# Patient Record
Sex: Female | Born: 1935
Health system: Southern US, Community
[De-identification: ages and names within clinical notes are randomized; demographics above are authoritative.]

## PROBLEM LIST (undated history)

## (undated) DIAGNOSIS — I712 Thoracic aortic aneurysm, without rupture, unspecified: Secondary | ICD-10-CM

## (undated) DIAGNOSIS — G4700 Insomnia, unspecified: Secondary | ICD-10-CM

## (undated) DIAGNOSIS — J302 Other seasonal allergic rhinitis: Secondary | ICD-10-CM

## (undated) DIAGNOSIS — I639 Cerebral infarction, unspecified: Secondary | ICD-10-CM

## (undated) DIAGNOSIS — R269 Unspecified abnormalities of gait and mobility: Secondary | ICD-10-CM

## (undated) DIAGNOSIS — R413 Other amnesia: Secondary | ICD-10-CM

## (undated) DIAGNOSIS — I341 Nonrheumatic mitral (valve) prolapse: Secondary | ICD-10-CM

## (undated) DIAGNOSIS — F32A Depression, unspecified: Secondary | ICD-10-CM

## (undated) DIAGNOSIS — M810 Age-related osteoporosis without current pathological fracture: Secondary | ICD-10-CM

## (undated) DIAGNOSIS — F329 Major depressive disorder, single episode, unspecified: Secondary | ICD-10-CM

## (undated) DIAGNOSIS — I4891 Unspecified atrial fibrillation: Secondary | ICD-10-CM

## (undated) DIAGNOSIS — M797 Fibromyalgia: Secondary | ICD-10-CM

## (undated) DIAGNOSIS — N3020 Other chronic cystitis without hematuria: Secondary | ICD-10-CM

## (undated) DIAGNOSIS — I350 Nonrheumatic aortic (valve) stenosis: Secondary | ICD-10-CM

## (undated) DIAGNOSIS — F419 Anxiety disorder, unspecified: Secondary | ICD-10-CM

## (undated) DIAGNOSIS — K219 Gastro-esophageal reflux disease without esophagitis: Secondary | ICD-10-CM

## (undated) DIAGNOSIS — E119 Type 2 diabetes mellitus without complications: Secondary | ICD-10-CM

## (undated) DIAGNOSIS — Z8673 Personal history of transient ischemic attack (TIA), and cerebral infarction without residual deficits: Secondary | ICD-10-CM

## (undated) DIAGNOSIS — N814 Uterovaginal prolapse, unspecified: Secondary | ICD-10-CM

## (undated) DIAGNOSIS — M1990 Unspecified osteoarthritis, unspecified site: Secondary | ICD-10-CM

## (undated) DIAGNOSIS — J449 Chronic obstructive pulmonary disease, unspecified: Secondary | ICD-10-CM

## (undated) HISTORY — PX: APPENDECTOMY: SHX54

## (undated) HISTORY — DX: Gastro-esophageal reflux disease without esophagitis: K21.9

## (undated) HISTORY — DX: Unspecified atrial fibrillation: I48.91

## (undated) HISTORY — DX: Cerebral infarction, unspecified: I63.9

## (undated) HISTORY — DX: Anxiety disorder, unspecified: F41.9

## (undated) HISTORY — DX: Major depressive disorder, single episode, unspecified: F32.9

## (undated) HISTORY — DX: Other amnesia: R41.3

## (undated) HISTORY — PX: TUBAL LIGATION: SHX77

## (undated) HISTORY — DX: Other seasonal allergic rhinitis: J30.2

## (undated) HISTORY — DX: Chronic obstructive pulmonary disease, unspecified: J44.9

## (undated) HISTORY — PX: OTHER SURGICAL HISTORY: SHX169

## (undated) HISTORY — PX: HERNIA REPAIR: SHX51

## (undated) HISTORY — DX: Nonrheumatic mitral (valve) prolapse: I34.1

## (undated) HISTORY — DX: Age-related osteoporosis without current pathological fracture: M81.0

## (undated) HISTORY — DX: Uterovaginal prolapse, unspecified: N81.4

## (undated) HISTORY — DX: Nonrheumatic aortic (valve) stenosis: I35.0

## (undated) HISTORY — DX: Thoracic aortic aneurysm, without rupture, unspecified: I71.20

## (undated) HISTORY — DX: Thoracic aortic aneurysm, without rupture: I71.2

## (undated) HISTORY — DX: Type 2 diabetes mellitus without complications: E11.9

## (undated) HISTORY — DX: Insomnia, unspecified: G47.00

## (undated) HISTORY — DX: Personal history of transient ischemic attack (TIA), and cerebral infarction without residual deficits: Z86.73

## (undated) HISTORY — DX: Fibromyalgia: M79.7

## (undated) HISTORY — DX: Unspecified abnormalities of gait and mobility: R26.9

## (undated) HISTORY — DX: Other chronic cystitis without hematuria: N30.20

## (undated) HISTORY — DX: Depression, unspecified: F32.A

## (undated) HISTORY — PX: EYE SURGERY: SHX253

## (undated) HISTORY — DX: Unspecified osteoarthritis, unspecified site: M19.90

---
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
## 1997-11-28 ENCOUNTER — Other Ambulatory Visit: Admission: RE | Admit: 1997-11-28 | Discharge: 1997-11-28 | Payer: Self-pay | Admitting: Obstetrics and Gynecology

## 1998-12-27 ENCOUNTER — Other Ambulatory Visit: Admission: RE | Admit: 1998-12-27 | Discharge: 1998-12-27 | Payer: Self-pay | Admitting: Obstetrics and Gynecology

## 1999-12-31 ENCOUNTER — Other Ambulatory Visit: Admission: RE | Admit: 1999-12-31 | Discharge: 1999-12-31 | Payer: Self-pay | Admitting: Obstetrics and Gynecology

## 2001-01-07 ENCOUNTER — Other Ambulatory Visit: Admission: RE | Admit: 2001-01-07 | Discharge: 2001-01-07 | Payer: Self-pay | Admitting: Obstetrics and Gynecology

## 2003-10-07 ENCOUNTER — Emergency Department (HOSPITAL_COMMUNITY): Admission: EM | Admit: 2003-10-07 | Discharge: 2003-10-07 | Payer: Self-pay | Admitting: *Deleted

## 2003-10-18 ENCOUNTER — Ambulatory Visit (HOSPITAL_COMMUNITY): Admission: RE | Admit: 2003-10-18 | Discharge: 2003-10-18 | Payer: Self-pay | Admitting: Pulmonary Disease

## 2003-12-05 ENCOUNTER — Encounter: Payer: Self-pay | Admitting: Internal Medicine

## 2004-01-04 ENCOUNTER — Ambulatory Visit: Payer: Self-pay | Admitting: Pulmonary Disease

## 2004-02-20 ENCOUNTER — Other Ambulatory Visit: Admission: RE | Admit: 2004-02-20 | Discharge: 2004-02-20 | Payer: Self-pay | Admitting: Obstetrics and Gynecology

## 2004-04-03 ENCOUNTER — Ambulatory Visit: Payer: Self-pay | Admitting: Pulmonary Disease

## 2004-08-01 ENCOUNTER — Ambulatory Visit: Payer: Self-pay | Admitting: Pulmonary Disease

## 2004-08-19 ENCOUNTER — Ambulatory Visit: Payer: Self-pay | Admitting: Gastroenterology

## 2004-09-06 ENCOUNTER — Ambulatory Visit: Payer: Self-pay | Admitting: Gastroenterology

## 2004-10-01 ENCOUNTER — Ambulatory Visit: Payer: Self-pay | Admitting: Gastroenterology

## 2004-10-01 ENCOUNTER — Encounter: Payer: Self-pay | Admitting: Internal Medicine

## 2004-11-22 ENCOUNTER — Ambulatory Visit (HOSPITAL_COMMUNITY): Admission: RE | Admit: 2004-11-22 | Discharge: 2004-11-22 | Payer: Self-pay | Admitting: Pulmonary Disease

## 2004-11-22 ENCOUNTER — Ambulatory Visit: Payer: Self-pay | Admitting: Pulmonary Disease

## 2005-01-30 ENCOUNTER — Ambulatory Visit: Payer: Self-pay | Admitting: Pulmonary Disease

## 2005-06-18 ENCOUNTER — Ambulatory Visit: Payer: Self-pay | Admitting: Pulmonary Disease

## 2005-10-07 ENCOUNTER — Ambulatory Visit: Payer: Self-pay | Admitting: Pulmonary Disease

## 2005-10-22 ENCOUNTER — Ambulatory Visit: Payer: Self-pay | Admitting: Pulmonary Disease

## 2006-02-05 ENCOUNTER — Ambulatory Visit: Payer: Self-pay | Admitting: Pulmonary Disease

## 2006-06-16 ENCOUNTER — Ambulatory Visit: Payer: Self-pay | Admitting: Pulmonary Disease

## 2006-07-28 ENCOUNTER — Ambulatory Visit: Payer: Self-pay | Admitting: Pulmonary Disease

## 2006-07-28 LAB — CONVERTED CEMR LAB
Cholesterol: 217 mg/dL (ref 0–200)
Direct LDL: 137.6 mg/dL
HDL: 40.1 mg/dL (ref >39.0)
Total CHOL/HDL Ratio: 5.4
VLDL: 30 mg/dL (ref 0–40)

## 2006-12-22 ENCOUNTER — Ambulatory Visit: Payer: Self-pay | Admitting: Pulmonary Disease

## 2007-02-10 ENCOUNTER — Telehealth: Payer: Self-pay | Admitting: Pulmonary Disease

## 2007-02-10 ENCOUNTER — Encounter: Payer: Self-pay | Admitting: Pulmonary Disease

## 2007-02-10 DIAGNOSIS — IMO0001 Reserved for inherently not codable concepts without codable children: Secondary | ICD-10-CM | POA: Insufficient documentation

## 2007-02-10 DIAGNOSIS — E785 Hyperlipidemia, unspecified: Secondary | ICD-10-CM | POA: Insufficient documentation

## 2007-02-10 DIAGNOSIS — I059 Rheumatic mitral valve disease, unspecified: Secondary | ICD-10-CM | POA: Insufficient documentation

## 2007-02-10 DIAGNOSIS — M810 Age-related osteoporosis without current pathological fracture: Secondary | ICD-10-CM | POA: Insufficient documentation

## 2007-02-10 DIAGNOSIS — N6019 Diffuse cystic mastopathy of unspecified breast: Secondary | ICD-10-CM | POA: Insufficient documentation

## 2007-04-22 ENCOUNTER — Ambulatory Visit: Payer: Self-pay | Admitting: Pulmonary Disease

## 2007-04-22 DIAGNOSIS — I679 Cerebrovascular disease, unspecified: Secondary | ICD-10-CM | POA: Insufficient documentation

## 2007-04-22 DIAGNOSIS — I739 Peripheral vascular disease, unspecified: Secondary | ICD-10-CM | POA: Insufficient documentation

## 2007-04-22 DIAGNOSIS — K2990 Gastroduodenitis, unspecified, without bleeding: Secondary | ICD-10-CM

## 2007-04-22 DIAGNOSIS — K2970 Gastritis, unspecified, without bleeding: Secondary | ICD-10-CM | POA: Insufficient documentation

## 2007-04-22 DIAGNOSIS — K5730 Diverticulosis of large intestine without perforation or abscess without bleeding: Secondary | ICD-10-CM | POA: Insufficient documentation

## 2007-04-22 DIAGNOSIS — K589 Irritable bowel syndrome without diarrhea: Secondary | ICD-10-CM | POA: Insufficient documentation

## 2007-04-22 LAB — CONVERTED CEMR LAB
Alkaline Phosphatase: 58 units/L (ref 39–117)
Basophils Absolute: 0 10*3/uL (ref 0.0–0.1)
Bilirubin, Direct: 0.2 mg/dL (ref 0.0–0.3)
Eosinophils Absolute: 0.1 10*3/uL (ref 0.0–0.6)
Eosinophils Relative: 2.1 % (ref 0.0–5.0)
GFR calc Af Amer: 105 mL/min
GFR calc non Af Amer: 87 mL/min
Glucose, Bld: 92 mg/dL (ref 70–99)
HCT: 38.6 % (ref 36.0–46.0)
HDL: 47.2 mg/dL (ref >39.0)
MCHC: 33.1 g/dL (ref 30.0–36.0)
Monocytes Absolute: 0.6 10*3/uL (ref 0.2–0.7)
Monocytes Relative: 9.2 % (ref 3.0–11.0)
RBC: 4.26 M/uL (ref 3.87–5.11)
Sodium: 141 meq/L (ref 135–145)
TSH: 1.25 microintl units/mL (ref 0.35–5.50)
Total CHOL/HDL Ratio: 3.9
Total Protein: 6.8 g/dL (ref 6.0–8.3)
Triglycerides: 77 mg/dL (ref 0–149)
VLDL: 15 mg/dL (ref 0–40)
WBC: 6.3 10*3/uL (ref 4.5–10.5)

## 2007-08-10 ENCOUNTER — Ambulatory Visit: Payer: Self-pay | Admitting: Pulmonary Disease

## 2008-02-10 ENCOUNTER — Ambulatory Visit: Payer: Self-pay | Admitting: Pulmonary Disease

## 2008-02-10 DIAGNOSIS — N812 Incomplete uterovaginal prolapse: Secondary | ICD-10-CM | POA: Insufficient documentation

## 2008-05-08 ENCOUNTER — Encounter: Admission: RE | Admit: 2008-05-08 | Discharge: 2008-05-08 | Payer: Self-pay | Admitting: Obstetrics and Gynecology

## 2008-06-06 ENCOUNTER — Encounter: Payer: Self-pay | Admitting: Pulmonary Disease

## 2008-08-10 ENCOUNTER — Ambulatory Visit: Payer: Self-pay | Admitting: Pulmonary Disease

## 2008-08-10 DIAGNOSIS — R636 Underweight: Secondary | ICD-10-CM | POA: Insufficient documentation

## 2008-08-10 LAB — CONVERTED CEMR LAB: Vit D, 25-Hydroxy: 63 ng/mL (ref 30–89)

## 2008-08-25 LAB — CONVERTED CEMR LAB
ALT: 18 units/L (ref 0–35)
Basophils Relative: 0.2 % (ref 0.0–3.0)
Bilirubin, Direct: 0.2 mg/dL (ref 0.0–0.3)
Creatinine, Ser: 0.8 mg/dL (ref 0.4–1.2)
Direct LDL: 141.9 mg/dL
Eosinophils Absolute: 0.1 10*3/uL (ref 0.0–0.7)
HCT: 38.3 % (ref 36.0–46.0)
HDL: 57 mg/dL (ref >39.00)
Lymphocytes Relative: 29.3 % (ref 12.0–46.0)
Lymphs Abs: 1.9 10*3/uL (ref 0.7–4.0)
MCHC: 34 g/dL (ref 30.0–36.0)
MCV: 90.3 fL (ref 78.0–100.0)
Monocytes Absolute: 0.5 10*3/uL (ref 0.1–1.0)
Neutro Abs: 3.9 10*3/uL (ref 1.4–7.7)
Neutrophils Relative %: 61.9 % (ref 43.0–77.0)
Platelets: 214 10*3/uL (ref 150.0–400.0)
RBC: 4.24 M/uL (ref 3.87–5.11)
RDW: 12.1 % (ref 11.5–14.6)
TSH: 0.76 microintl units/mL (ref 0.35–5.50)
Total CHOL/HDL Ratio: 4
Triglycerides: 85 mg/dL (ref 0.0–149.0)
VLDL: 17 mg/dL (ref 0.0–40.0)
Vitamin B-12: >1500 pg/mL — ABNORMAL HIGH (ref 211–911)

## 2008-09-21 ENCOUNTER — Telehealth: Payer: Self-pay | Admitting: Pulmonary Disease

## 2008-11-06 ENCOUNTER — Telehealth (INDEPENDENT_AMBULATORY_CARE_PROVIDER_SITE_OTHER): Payer: Self-pay | Admitting: *Deleted

## 2009-01-01 ENCOUNTER — Telehealth: Payer: Self-pay | Admitting: Pulmonary Disease

## 2009-04-16 ENCOUNTER — Telehealth (INDEPENDENT_AMBULATORY_CARE_PROVIDER_SITE_OTHER): Payer: Self-pay | Admitting: *Deleted

## 2009-05-03 ENCOUNTER — Ambulatory Visit: Payer: Self-pay | Admitting: Pulmonary Disease

## 2009-05-03 ENCOUNTER — Encounter (INDEPENDENT_AMBULATORY_CARE_PROVIDER_SITE_OTHER): Payer: Self-pay | Admitting: *Deleted

## 2009-05-03 DIAGNOSIS — R059 Cough, unspecified: Secondary | ICD-10-CM | POA: Insufficient documentation

## 2009-05-03 DIAGNOSIS — R1319 Other dysphagia: Secondary | ICD-10-CM | POA: Insufficient documentation

## 2009-05-03 DIAGNOSIS — R05 Cough: Secondary | ICD-10-CM

## 2009-05-06 DIAGNOSIS — R259 Unspecified abnormal involuntary movements: Secondary | ICD-10-CM | POA: Insufficient documentation

## 2009-05-06 LAB — CONVERTED CEMR LAB
ALT: 27 units/L (ref 0–35)
AST: 25 units/L (ref 0–37)
Albumin: 4.5 g/dL (ref 3.5–5.2)
BUN: 11 mg/dL (ref 6–23)
Basophils Relative: 0.5 % (ref 0.0–3.0)
Bilirubin, Direct: 0.1 mg/dL (ref 0.0–0.3)
Eosinophils Absolute: 0 10*3/uL (ref 0.0–0.7)
GFR calc non Af Amer: 103.48 mL/min (ref >60)
Glucose, Bld: 100 mg/dL — ABNORMAL HIGH (ref 70–99)
HCT: 38 % (ref 36.0–46.0)
Hemoglobin: 12.6 g/dL (ref 12.0–15.0)
LDL Cholesterol: 106 mg/dL — ABNORMAL HIGH (ref 0–99)
MCHC: 33.1 g/dL (ref 30.0–36.0)
MCV: 93 fL (ref 78.0–100.0)
Monocytes Absolute: 0.4 10*3/uL (ref 0.1–1.0)
RBC: 4.08 M/uL (ref 3.87–5.11)
RDW: 11.9 % (ref 11.5–14.6)
Sed Rate: 11 mm/hr (ref 0–22)
TSH: 1.05 microintl units/mL (ref 0.35–5.50)
Total Bilirubin: 0.6 mg/dL (ref 0.3–1.2)
Total CHOL/HDL Ratio: 3
Total Protein: 7.3 g/dL (ref 6.0–8.3)
Triglycerides: 65 mg/dL (ref 0.0–149.0)
VLDL: 13 mg/dL (ref 0.0–40.0)
Vit D, 25-Hydroxy: 74 ng/mL (ref 30–89)
WBC: 6.5 10*3/uL (ref 4.5–10.5)

## 2009-05-31 ENCOUNTER — Telehealth (INDEPENDENT_AMBULATORY_CARE_PROVIDER_SITE_OTHER): Payer: Self-pay | Admitting: *Deleted

## 2009-05-31 ENCOUNTER — Encounter: Admission: RE | Admit: 2009-05-31 | Discharge: 2009-05-31 | Payer: Self-pay | Admitting: Pulmonary Disease

## 2009-06-07 ENCOUNTER — Ambulatory Visit: Payer: Self-pay | Admitting: Internal Medicine

## 2009-06-07 DIAGNOSIS — R198 Other specified symptoms and signs involving the digestive system and abdomen: Secondary | ICD-10-CM | POA: Insufficient documentation

## 2009-06-14 ENCOUNTER — Telehealth (INDEPENDENT_AMBULATORY_CARE_PROVIDER_SITE_OTHER): Payer: Self-pay | Admitting: *Deleted

## 2009-06-19 ENCOUNTER — Ambulatory Visit (HOSPITAL_COMMUNITY): Admission: RE | Admit: 2009-06-19 | Discharge: 2009-06-19 | Payer: Self-pay | Admitting: Internal Medicine

## 2009-06-26 ENCOUNTER — Ambulatory Visit (HOSPITAL_COMMUNITY): Admission: RE | Admit: 2009-06-26 | Discharge: 2009-06-26 | Payer: Self-pay | Admitting: Internal Medicine

## 2009-07-13 ENCOUNTER — Telehealth (INDEPENDENT_AMBULATORY_CARE_PROVIDER_SITE_OTHER): Payer: Self-pay | Admitting: *Deleted

## 2009-11-01 ENCOUNTER — Ambulatory Visit: Payer: Self-pay | Admitting: Pulmonary Disease

## 2009-11-05 ENCOUNTER — Ambulatory Visit: Payer: Self-pay | Admitting: Internal Medicine

## 2009-11-05 ENCOUNTER — Encounter (INDEPENDENT_AMBULATORY_CARE_PROVIDER_SITE_OTHER): Payer: Self-pay | Admitting: *Deleted

## 2009-11-29 ENCOUNTER — Ambulatory Visit: Payer: Self-pay | Admitting: Internal Medicine

## 2010-02-24 DIAGNOSIS — Z8673 Personal history of transient ischemic attack (TIA), and cerebral infarction without residual deficits: Secondary | ICD-10-CM

## 2010-02-24 HISTORY — DX: Personal history of transient ischemic attack (TIA), and cerebral infarction without residual deficits: Z86.73

## 2010-03-16 ENCOUNTER — Encounter: Payer: Self-pay | Admitting: Pulmonary Disease

## 2010-03-28 NOTE — Letter (Signed)
Summary: New Patient letter  [facility]  [address] [place], [place] [address]   Phone: [phone]  Fax: [phone]       05/03/2009 MRN: [id number]  [facility] [address] [place], [place]  [address]  Dear Ms. Mccullers,  Welcome to the Gastroenterology Division at [facility].    You are scheduled to see Dr. Leone Payor on 06-04-09 at 10:00a.m. on the 3rd floor at [facility], [address]  We ask that you try to arrive at our office 15 minutes prior to your appointment time to allow for check-in.  We would like you to complete the enclosed self-administered evaluation form prior to your visit and bring it with you on the day of your appointment.  We will review it with you.  Also, please bring a complete list of all your medications or, if you prefer, bring the medication bottles and we will list them.  Please bring your insurance card so that we may make a copy of it.  If your insurance requires a referral to see a specialist, please bring your referral form from your primary care physician.  Co-payments are due at the time of your visit and may be paid by cash, check or credit card.     Your office visit will consist of a consult with your physician (includes a physical exam), any laboratory testing he/she may order, scheduling of any necessary diagnostic testing (e.g. x-ray, ultrasound, CT-scan), and scheduling of a procedure (e.g. Endoscopy, Colonoscopy) if required.  Please allow enough time on your schedule to allow for any/all of these possibilities.    If you cannot keep your appointment, please call [phone] to cancel or reschedule prior to your appointment date.  This allows [place] the opportunity to schedule an appointment for another patient in need of care.  If you do not cancel or reschedule by 5 p.m. the business day prior to your appointment date, you will be charged a $50.00 late cancellation/no-show fee.    Thank you for choosing  [facility] for your medical needs.  We appreciate the opportunity to care for you.  Please visit [place] at our website  to learn more about our practice.                     Sincerely,                                                             The Gastroenterology Division

## 2010-03-28 NOTE — Progress Notes (Signed)
Summary: Records request from Dr. Philemon Kingdom, [facility].  Request for records received from Dr. Philemon Kingdom with [facility] Request forwarded to [employer]. Dena Chavis  June 14, 2009 3:24 PM

## 2010-03-28 NOTE — Assessment & Plan Note (Signed)
Summary: 6+ months/apc   CC:  9 month ROV & review of mult medical problems....  History of Present Illness: 77 y/o WF here for a follow up visit... he has multiple medical problems as noted below...    ~  6/09:  we had a long discussion about her OSTEOPOROSIS previously and recommended FORTEO Rx... she says that she researched this on the internet and refuses to consider this medication since "the side effects outweigh the benefit" and "it didn't help my sister"... I have told her before and I told her again that I fear she is high risk for fractures (spine, hips, others) and she is in for alot of pain and discomfort that might be avoided by Osteoporosis therapy... she refuses to consider ANY treatment (other than calcium and vitamins)... she also refuses a second opinion consult w/ Endocrine, etc... I have encouraged her to discuss this w/ her GYN as well- DrRoss, DrSilva...   ~  February 10, 2008:  we reviewed the above, and she is resolute in her decision to NOT take any osteoporosis Rx- oral bisphosphonates, reclast, forteo, miacalcin, etc... states she discussed this w/ GYN and they are aware... she had the seasonal flu shot but refuses the H1N1 vaccine...   ~  August 10, 2008:  101mo ROV & doing about the same she says... her fibromyalgia is "acting up" again- on Amitrip 25mg Qhs & DCN100 Prn... offered SAVELLA trial & given sample... new PROLIA therapy avail for Osteoporosis now and offered to the pt- she refuses but to apease me she says she will look into it...   ~  May 03, 2009:  she has mult mult somatic complaints today>> she is very anxious, talks w/ flight of ideas, etc... she fell in Nov and "popped my back"- didn't go to ER or Ortho... she continues to see Chiropractor regularly & states that adjustments helped (they also help her Headaches- but "medicare only allowed 12 per yr)...  she also describes what sounds like Raynaud's & I offered add'l med, but she declines more pills... she  will see a new Rheumatologist in [place]- [employer].   Current Problem List:  MITRAL VALVE PROLAPSE - exam w/ mult msc's... hx of occas palpit but nothing sustained and no CP, etc... she is very active at home... no recent symptoms...  ~  prev 2DEcho 10/02 showed mild asymmetric LVH & norm EF, there was mild MR...   ~  NuclearStressTest 8/05 showed ?mild ischemia distal anterolat wall, norm wall motion, norm EF=68%...  PERIPHERAL VASCULAR DISEASE - on ASA 81mg /d...  ~  CTChest 8/05 showed 4cm ascending thorAA...  CEREBROVASCULAR DISEASE - on ASA 81mg /d... MRIBrain 9/06 w/ atrophy, chr sm vessel ischemic dis, remote left hemisphere infarct, ectasia of cerebral vessels...  HYPERLIPIDEMIA- she refuses cholesterol meds... uses diet alone + supplements w/ Fish Oil, CoQ10...   ~  FLP 6/08 showed TChol 217, TG 151, HDL 40, LDL 138...  ~  FLP 2/09 showed TChol 184, TG 97, HDL 47, LDL 121...  ~  FLP 6/10 showed TChol 208, TG 85, HDL 57, LDL 142  UNDERWEIGHT (ICD-783.22) - we discussed improved diet & caloric intake w/ supplements...  she is 5\' 5"  tall w/ BMI < 19...  ~  weight 6/10 = 106#  ~  weight 3/11 = 103#  Hx of GASTRITIS - EGD by DrSam 10/05 showed chr gastritis... she takes OTC H2blockers as needed.  ~  3/11: notes some swallowing difficulty but blames the Fosamax she last took 4  yrs ago... refer to GI for eval.  DIVERTICULOSIS OF COLON & IRRITABLE BOWEL SYNDROME - last colonoscopy was 9/06 by DrSam showing divertics + hems... f/u planned 78yrs...  FIBROCYSTIC BREAST DISEASE  CYSTOCELE WITH INCOMPLETE UTERINE PROLAPSE (ICD-618.2) - she is followed for GYN by DrSilva- hx of UTI's and large cystocele... she didn't tol a standard pessary but doing better w/ a new small pessary...  ~  6/10: she report new smaller pessary is working much better w/o infections, and cystocele helped.  FIBROMYALGIA - followed by Rober Minion and also sees chiropractor in [place]... on AMITRIPTYLINE  25mg Qhs, ALPRAZOLAM 0.25mg  Prn,  & now SAVELLA 50mg Bid (but she blames her somatic complaints on the med).  ~  6/10: she's been having a flair of her fibromyalgia w/ fibro-fog symptoms... she notes her cycles have a 3 month periodicity to them... states Amitriptyline & Alprazolam help + adjustments from chiropractor Q6wks...  offered the new SAVELLA Rx w/ starter pack & she will try it.  ~  3/11: on Savella 50mg  Bid w/ some benefit, she says- but she wants off due to "side effects".  OSTEOPOROSIS  - BMD at [facility] 7/08 w/ severe osteoporosis- TScores -3.9 in spine, and -3.6 in fem neck... she has talked to her GYN Rise Patience), and to [employer]... she needs Rx but is intolerant to Bisphosphonates (Fosamax) & Evista... we further discussed options--- FORTEO, RECLAST, MIACALCIN... I do not believe the miacalcin would be of benefit... she needs FORTEO now and then RECLAST later... she is only 77 y/o and is very high risk for signif problems from her osteoporosis!!!  She states that DrDeveshwar checked her VitD level and that it was OK... SEE ABOVE- she has refused Forteo, Reclast, other Bisphos therapy...  ~  6/10: we reviewed all avail osteoporosis meds & the new PROLIA therapy- she wants to "read-up" on this before deciding...  ~  3/11:  she still refuses to consider any additional osteoporosis med... she will check w/ Rheum in [place].  TREMOR (ICD-781.0) - she has seen DrReynolds at [facility] x3 & refuses to go back... prev HA eval showed "a bulge- could be aneurysm" & he wanted to do arteriogram (not done)... she has mult somatic complaints- itchy scalp, blurred vision, etc...  ~  3/11: she reports rubbing a "testosterone" cream from the health food store behind her ears gets rid of her HAs (sisters Rx after watching Oprah)...  STRESS DISORDER - on ALPRAZOLAM 0.5mg - 1/2 to 1 tab Tid Prn (uses one Qhs)...   Allergies: 1)  ! Pcn 2)  ! Codeine 3)  ! Effexor 4)  ! Lexapro 5)  !  Bisphosphonates  Comments:  Nurse/Medical Assistant: The patient's medications and allergies were reviewed with the patient and were updated in the Medication and Allergy Lists.  Past History:  Past Medical History:  MITRAL VALVE PROLAPSE (ICD-424.0) PERIPHERAL VASCULAR DISEASE (ICD-443.9) CEREBROVASCULAR DISEASE (ICD-437.9) HYPERLIPIDEMIA (ICD-272.4) UNDERWEIGHT (ICD-783.22) Hx of GASTRITIS (ICD-535.50) DIVERTICULOSIS OF COLON (ICD-562.10) IRRITABLE BOWEL SYNDROME (ICD-564.1) FIBROCYSTIC BREAST DISEASE (ICD-610.1) CYSTOCELE WITH INCOMPLETE UTERINE PROLAPSE (ICD-618.2) FIBROMYALGIA (ICD-729.1) OSTEOPOROSIS (ICD-733.00) TREMOR (ICD-781.0) STRESS DISORDER (ICD-V62.89)  Past Surgical History: S/P cystocele repair  Family History: Reviewed history from 08/10/2007 and no changes required. mother died age 32 from bright's disease father died age 27 from pneumonia 2 siblings 1 died age 32 from cancer 1 alive age 36  Social History: Reviewed history from 08/10/2008 and no changes required. retired never smoked re-married--james Mccready 3 children  Review of Systems  See HPI       The patient complains of weight loss, dyspnea on exertion, and muscle weakness.  The patient denies anorexia, fever, weight gain, vision loss, decreased hearing, hoarseness, chest pain, syncope, peripheral edema, prolonged cough, headaches, hemoptysis, abdominal pain, melena, hematochezia, severe indigestion/heartburn, hematuria, incontinence, suspicious skin lesions, transient blindness, difficulty walking, depression, unusual weight change, abnormal bleeding, enlarged lymph nodes, and angioedema.    Vital Signs:  Patient profile:   77 year old female Height:      66 inches Weight:      103.13 pounds O2 Sat:      99 % on Room air Temp:     98.4 degrees F oral Pulse rate:   86 / minute BP sitting:   122 / 72  (left arm) Cuff size:   regular  Vitals Entered By: Randell Loop CMA (May 03, 2009 9:28 AM)  O2 Sat at Rest %:  99 O2 Flow:  Room air  Physical Exam  Additional Exam:  WD, Thin, 77 y/o WF in NAD... GENERAL:  Alert & oriented; pleasant & cooperative... HEENT:  [place]/AT, EOM-wnl, PERRLA, EACs-clear, TMs-wnl, NOSE-clear, THROAT-clear & wnl. NECK:  Supple w/ fair ROM; no JVD; prominent carotid impulses w/o bruits; no thyromegaly or nodules palpated; no lymphadenopathy. CHEST:  Clear to P & A; without wheezes/ rales/ or rhonchi heard... HEART:  Regular Rhythm; +mcs's and gr 1/6 SEM without rubs or gallops... ABDOMEN:  Thin, soft & nontender; normal bowel sounds; no organomegaly or masses detected. EXT: without deformities, mild arthritic changes; no varicose veins/ venous insuffic/ or edema. NEURO:  CN's intact; no focal neuro deficits... DERM:  No lesions noted; no rash etc...    CXR  Procedure date:  05/03/2009  Findings:      CHEST - 2 VIEW Comparison: None.   Findings:  The heart size and mediastinal contours are within normal limits.  Both lungs are clear.  Lungs are hyperaerated.  The visualized skeletal structures are unremarkable.   IMPRESSION: No active cardiopulmonary disease. Query COPD.   Read By:  Jonne Ply,  M.D.   MISC. Report  Procedure date:  05/03/2009  Findings:      Lipid Panel (LIPID)   Cholesterol               180 mg/dL                   1-191   Triglycerides             65.0 mg/dL                  4.7-829.5   HDL                       62.13 mg/dL                 >08.65   LDL Cholesterol      [H]  784 mg/dL                   6-96  BMP (METABOL)   Sodium                    142 mEq/L                   135-145   Potassium                 3.9 mEq/L  3.5-5.1   Chloride                  106 mEq/L                   96-112   Carbon Dioxide       [H]  35 mEq/L                    19-32   Glucose              [H]  100 mg/dL                   29-56   BUN                       11 mg/dL                     2-13   Creatinine                0.6 mg/dL                   0.8-6.5   Calcium                   10.5 mg/dL                  7.8-46.9   GFR                       103.48 mL/min               >60  Hepatic/Liver Function Panel (HEPATIC)   Total Bilirubin           0.6 mg/dL                   6.2-9.5   Direct Bilirubin          0.1 mg/dL                   2.8-4.1   Alkaline Phosphatase      55 U/L                      39-117   AST                       25 U/L                      0-37   ALT                       27 U/L                      0-35   Total Protein             7.3 g/dL                    3.2-4.4   Albumin                   4.5 g/dL                    0.1-0.2  Comments:      CBC Platelet w/Diff (CBCD)   White Cell Count          6.5 K/uL  4.5-10.5   Red Cell Count            4.08 Mil/uL                 3.87-5.11   Hemoglobin                12.6 g/dL                   16.1-09.6   Hematocrit                38.0 %                      36.0-46.0   MCV                       93.0 fl                     78.0-100.0   Platelet Count            247.0 K/uL                  150.0-400.0   Neutrophil %              68.4 %                      43.0-77.0   Lymphocyte %              23.6 %                      12.0-46.0   Monocyte %                6.9 %                       3.0-12.0   Eosinophils%              0.6 %                       0.0-5.0   Basophils %               0.5 %                       0.0-3.0  TSH (TSH)   FastTSH                   1.05 uIU/mL                 0.35-5.50  Sed Rate (ESR)   Sed Rate                  11 mm/hr                    0-22   MISC. Report  Procedure date:  05/03/2009  Findings:      LAB DONE AT Patient's Request: CA125 (23750)   CA125                     6.3 U/mL                    0.0-30.2  Vitamin D (25-Hydroxy) (04540)  Vitamin D (25-Hydroxy)  74 ng/mL                    30-89   Impression &  Recommendations:  Problem # 1:  OTHER DYSPHAGIA (ICD-787.29) She is complaining of swallowing difficulty & we will refer to GI for their eval... Orders: Gastroenterology Referral (GI)  Problem # 2:  MITRAL VALVE PROLAPSE (ICD-424.0) Exam unchanged and no acute symptoms... Her updated medication list for this problem includes:    Adult Aspirin Ec Low Strength 81 Mg Tbec (Aspirin) ..... Once daily  Orders: T-2 View CXR (71020TC) >> lungs clear, sl hyperinflation, NAD...  Problem # 3:  CEREBROVASCULAR DISEASE (ICD-437.9) Continue ASA 81mg ... Orders: TLB-Lipid Panel (80061-LIPID) TLB-BMP (Basic Metabolic Panel-BMET) (80048-METABOL) TLB-Hepatic/Liver Function Pnl (80076-HEPATIC) TLB-CBC Platelet - w/Differential (85025-CBCD) TLB-TSH (Thyroid Stimulating Hormone) (84443-TSH) TLB-Sedimentation Rate (ESR) (85652-ESR) T-CA 125 ([address]) T-Vitamin D (25-Hydroxy) (09811-91478)  Problem # 4:  HYPERLIPIDEMIA (ICD-272.4) She is taking Fish Oil and CoQ10...  Problem # 5:  UNDERWEIGHT (GNF-621.30) We discussed nutritional supplements...  Problem # 6:  CYSTOCELE WITH INCOMPLETE UTERINE PROLAPSE (ICD-618.2) Per Gyn w/ pessary helping...  Problem # 7:  FIBROMYALGIA (ICD-729.1) She states that the Amitrip + Savella are helping her FM but she blames alot of her current somatic complaints on the Savella- therefore she will wean this down to see if the symptoms resolve... The following medications were removed from the medication list:    Propoxyphene N-apap 100-650 Mg Tabs (Propoxyphene n-apap) .Marland Kitchen... Take 1 tab by mouth every 6-8 h as needed for pain... Her updated medication list for this problem includes:    Adult Aspirin Ec Low Strength 81 Mg Tbec (Aspirin) ..... Once daily  Problem # 8:  OSTEOPOROSIS (ICD-733.00) Her Vit D level is good... she adamantly refuses medications for her bones...  Problem # 9:  STRESS DISORDER (ICD-V62.89) She is advised to incr the Alpraz 1/2 tab 2-3 times  daily...  Problem # 10:  OTHER MEDICAL PROBLEMS AS NOTED>>>   Complete Medication List: 1)  Adult Aspirin Ec Low Strength 81 Mg Tbec (Aspirin) .... Once daily 2)  Eql Fish Oil 1000 Mg Caps (Omega-3 fatty acids) .... Take 1 tablet by mouth once a day 3)  Coq10 100 Mg Caps (Coenzyme q10) .... Take one tablet by mouth two times a day 4)  Amitriptyline Hcl 25 Mg Tabs (Amitriptyline hcl) .... Take 1 tab by mouth at bedtime.Marland KitchenMarland Kitchen 5)  Alprazolam 0.5 Mg Tabs (Alprazolam) .... Take 1/2 to 1 tab  by mouth three times a day as directed... 6)  Multivitamins Tabs (Multiple vitamin) .... Take 1 tablet by mouth once a day 7)  Savella 50 Mg Tabs (Milnacipran hcl) .... Wean the savella to one tab daily til gone- then stop.  Patient Instructions: 1)  Today we updated your med list- see below.... 2)  Today we did your follow up CXR & fasting blood work... please call the "phone tree" in a few days for your lab results.Marland KitchenMarland Kitchen 3)  We have included the Ca125 test as you requested. 4)  We will arrange for a GI consult w/ DrGessner regarding your swallowing difficulty.Marland KitchenMarland Kitchen 5)  I recommend that your wean off the Savella to see if the itchy scalp/ blurred vision/ dry mouth/ & urinary symptoms all resolve (cut it to 1 tab daily til gone, then stop)... 6)  I also recommend that you increase the ALPRAZOLAM to 1/2 tab 2-3 times daily (eg at meals) & 1 tab at bedtime... the lower dose is to help  w/ the Tremor. 7)  You may continue to use the "salve" you got from the [employer] to help w/ your headaches... 8)  Call for any questions.Marland KitchenMarland Kitchen 9)  We should recheck you in 6months...        Self-Management Support :    Lipid self-management support: Not documented

## 2010-03-28 NOTE — Assessment & Plan Note (Signed)
Summary: 6 months/apc   Primary Care Provider:  Alroy Dust, MD  CC:  6 month ROV & review of mult medical prob....  History of Present Illness: 77 y/o WF here for a follow up visit... he has multiple medical problems as noted below...    ~  Jun09:  we had a long discussion about her OSTEOPOROSIS previously and recommended FORTEO Rx... she says that she researched this on the internet and refuses to consider this medication since "the side effects outweigh the benefit" and "it didn't help my sister"... I have told her before and I told her again that I fear she is high risk for fractures (spine, hips, others) and she at risk for alot of pain and discomfort that might be avoided by Osteoporosis therapy... she refuses to consider ANY treatment (other than calcium and vitamins)... she also refuses a second opinion consult w/ Endocrine, etc... I have encouraged her to discuss this w/ her GYN as well- DrRoss, DrSilva...  ~  Dec09:  we reviewed the above, and she is resolute in her decision to NOT take any osteoporosis Rx- oral bisphosphonates, reclast, forteo, miacalcin, etc... states she discussed this w/ GYN and they are aware... she had the seasonal flu shot but refuses the H1N1 vaccine...   ~  Jun10:  66mo ROV & doing about the same she says... her fibromyalgia is "acting up" again- on Amitrip 25mg Qhs & DCN100 Prn... offered SAVELLA trial & given sample... new PROLIA therapy avail for Osteoporosis now and offered to the pt- she refuses but to apease me she says she will look into it...   ~  May 03, 2009:  she has mult mult somatic complaints today>> she is very anxious, talks w/ flight of ideas, etc... she fell in Nov and "popped my back"- didn't go to ER or Ortho... she continues to see Chiropractor regularly & states that adjustments helped (they also help her Headaches- but "medicare only allowed 12 per yr)...  she also describes what sounds like Raynaud's & I offered add'l med, but she declines  more pills... she will see a new Rheumatologist in [place]- DrProchnau.   ~  November 01, 2009:  she is seeing DrProchnau in [place] for primary care & Rheum she says... she has Barium swallow +for reflux w/ sl asp seen & MBS ordered by DrGessner that was neg... she has chr constipation also Rx by DrPerry & pelvic prolapse Rx by GYN w/ pessary... she has mult somatic complaints, it is hard to make sense of it all- she saw Gayla Medicus, ENT 2 wks ago- ears OK, hearing is decr...    Current Problem List:  MITRAL VALVE PROLAPSE - exam w/ mult msc's... hx of occas palpit but nothing sustained and no CP, etc... she is very active at home... no recent symptoms...  ~  prev 2DEcho 10/02 showed mild asymmetric LVH & norm EF, there was mild MR...   ~  NuclearStressTest 8/05 showed ?mild ischemia distal anterolat wall, norm wall motion, norm EF=68%...  PERIPHERAL VASCULAR DISEASE - on ASA 81mg /d...  ~  CTChest 8/05 showed 4cm ascending thorAA...  CEREBROVASCULAR DISEASE - on ASA 81mg /d... MRIBrain 9/06 w/ atrophy, chr sm vessel ischemic dis, remote left hemisphere infarct, ectasia of cerebral vessels...  HYPERLIPIDEMIA- she refuses cholesterol meds... uses diet alone + supplements w/ Fish Oil, CoQ10...   ~  FLP 6/08 showed TChol 217, TG 151, HDL 40, LDL 138  ~  FLP 2/09 showed TChol 184, TG 97, HDL 47, LDL  121  ~  FLP 6/10 showed TChol 208, TG 85, HDL 57, LDL 142  ~  FLP 3/11 showed TChol 180, TG 65, HDL 61, LDL 106  UNDERWEIGHT (ICD-783.22) - we discussed improved diet & caloric intake w/ supplements...  she is 5\' 5"  tall w/ BMI < 19...  ~  weight 6/10 = 106#  ~  weight 3/11 = 103#  ~  weight 9/11 = 113#  Hx of GASTRITIS - EGD by DrSam 10/05 showed chr gastritis... she takes OTC H2blockers as needed.  ~  3/11: notes some swallowing difficulty but blames the Fosamax she last took 4 yrs ago... refer to GI for eval.  DIVERTICULOSIS OF COLON & IRRITABLE BOWEL SYNDROME - last colonoscopy was 9/06 by  DrSam showing divertics + hems... f/u planned 58yrs...  FIBROCYSTIC BREAST DISEASE  CYSTOCELE WITH INCOMPLETE UTERINE PROLAPSE (ICD-618.2) - she is followed for GYN by DrSilva- hx of UTI's and large cystocele... she didn't tol a standard pessary but doing better w/ a new small pessary...  ~  6/10: she report new smaller pessary is working much better w/o infections, and cystocele helped.  FIBROMYALGIA - followed by Rober Minion and also sees chiropractor in [place] & now DrProchnau ("fibromyalgia specialist")... on AMITRIPTYLINE 50mg Qhs, ALPRAZOLAM 0.25mg  Prn,  & now off Savella Rx (too many side effects, she says), & INTOL to Zoloft as well.  ~  6/10: she's been having a flair of her fibromyalgia w/ fibro-fog symptoms... she notes her cycles have a 3 month periodicity to them... states Amitriptyline & Alprazolam help + adjustments from chiropractor Q6wks...  offered the new SAVELLA Rx w/ starter pack & she will try it.  ~  3/11: on Savella 50mg  Bid w/ some benefit, she says- but she wants off due to "side effects".  ~  9/11: she reports seeing new doctor in [place]- DrProchnau for rheum & primary care needs.  OSTEOPOROSIS  - BMD at [facility] 7/08 w/ severe osteoporosis- TScores -3.9 in spine, and -3.6 in fem neck... she has talked to her GYN Rise Patience), and to [employer]... she needs Rx but is intolerant to Bisphosphonates (swallowing trouble) & Evista (trouble w/ knees)... we further discussed options--- FORTEO, RECLAST, MIACALCIN... I do not believe the miacalcin would be of benefit... she needs FORTEO now and then RECLAST later... she is only 77 y/o and is very high risk for signif problems from her osteoporosis!!!  She states that DrDeveshwar checked her VitD level and that it was OK... SEE ABOVE- she has refused Forteo, Reclast, other Bisphos therapy...  ~  6/10: we reviewed all avail osteoporosis meds & the new PROLIA therapy- she wants to "read-up" on this before deciding...  ~  3/11:  she  still refuses to consider any additional osteoporosis med... she will check w/ Rheum in [place].  TREMOR (ICD-781.0) - she has seen DrReynolds at [facility] x3 & refuses to go back... prev HA eval showed "a bulge- could be aneurysm" & he wanted to do arteriogram (not done)... she has mult somatic complaints- itchy scalp, blurred vision, etc...  ~  3/11: she reports rubbing a "testosterone" cream from the health food store behind her ears gets rid of her HAs (sisters Rx after watching Oprah)...  STRESS DISORDER - on ALPRAZOLAM 0.5mg - 1/2 to 1 tab Tid Prn (uses one Qhs)...   Preventive Screening-Counseling & Management  Alcohol-Tobacco     Smoking Status: never  Caffeine-Diet-Exercise     Does Patient Exercise: no  Allergies: 1)  ! Pcn 2)  !  Codeine 3)  ! Effexor 4)  ! Lexapro 5)  ! Bisphosphonates 6)  ! Amada Jupiter  Past History:  Past Medical History: MITRAL VALVE PROLAPSE (ICD-424.0) PERIPHERAL VASCULAR DISEASE (ICD-443.9) CEREBROVASCULAR DISEASE (ICD-437.9) HYPERLIPIDEMIA (ICD-272.4) UNDERWEIGHT (ICD-783.22) Hx of GASTRITIS (ICD-535.50) DIVERTICULOSIS OF COLON (ICD-562.10) IRRITABLE BOWEL SYNDROME (ICD-564.1) FIBROCYSTIC BREAST DISEASE (ICD-610.1) CYSTOCELE WITH INCOMPLETE UTERINE PROLAPSE (ICD-618.2) FIBROMYALGIA (ICD-729.1) OSTEOPOROSIS (ICD-733.00) TREMOR (ICD-781.0) STRESS DISORDER (ICD-V62.89)  Past Surgical History: S/P cystocele repair Appendectomy  Family History: Reviewed history from 06/07/2009 and no changes required. mother died age 27 from bright's disease father died age 68 from pneumonia 2 siblings 1 died age 57 from cancer 1 alive age 80 Family History of Breast Cancer: Sister No FH of Colon Cancer: Family History of Colon Polyps:Sister Family History of Ovarian Cancer: Sister Family History of Diabetes: Son, Daughter, Maternal Uncle x 3, Maternal Aunt, Cousins  Social History: Reviewed history from 06/07/2009 and no changes  required. retired never smoked re-married--james Bohall 3 children Illicit Drug Use - no Patient does not get regular exercise.   Review of Systems      See HPI       The patient complains of dyspnea on exertion and muscle weakness.  The patient denies anorexia, fever, weight loss, weight gain, vision loss, decreased hearing, hoarseness, chest pain, syncope, peripheral edema, prolonged cough, headaches, hemoptysis, abdominal pain, melena, hematochezia, severe indigestion/heartburn, hematuria, incontinence, suspicious skin lesions, transient blindness, difficulty walking, depression, unusual weight change, abnormal bleeding, enlarged lymph nodes, and angioedema.    Vital Signs:  Patient profile:   77 year old female Height:      66 inches Weight:      113.13 pounds O2 Sat:      100 % on Room air Temp:     98.0 degrees F oral Pulse rate:   82 / minute BP sitting:   120 / 70  (left arm) Cuff size:   regular  Vitals Entered By: Randell Loop CMA (November 01, 2009 10:08 AM)  O2 Sat at Rest %:  100 O2 Flow:  Room air CC: 6 month ROV & review of mult medical prob... Is Patient Diabetic? No Pain Assessment Patient in pain? no      Comments meds updated today with pt   Physical Exam  Additional Exam:  WD, Thin, 77 y/o WF in NAD... GENERAL:  Alert & oriented; pleasant & cooperative... HEENT:  [place]/AT, EOM-wnl, PERRLA, EACs-clear, TMs-wnl, NOSE-clear, THROAT-clear & wnl. NECK:  Supple w/ fair ROM; no JVD; prominent carotid impulses w/o bruits; no thyromegaly or nodules palpated; no lymphadenopathy. CHEST:  Clear to P & A; without wheezes/ rales/ or rhonchi heard... HEART:  Regular Rhythm; +mcs's and gr 1/6 SEM without rubs or gallops... ABDOMEN:  Thin, soft & nontender; normal bowel sounds; no organomegaly or masses detected. EXT: without deformities, mild arthritic changes; no varicose veins/ venous insuffic/ or edema. NEURO:  CN's intact; no focal neuro deficits... DERM:  No  lesions noted; no rash etc...    Impression & Recommendations:  Problem # 1:  MITRAL VALVE PROLAPSE (ICD-424.0) She denies signif palpit or CP etc... Her updated medication list for this problem includes:    Adult Aspirin Ec Low Strength 81 Mg Tbec (Aspirin) ..... Once daily  Problem # 2:  CEREBROVASCULAR DISEASE (ICD-437.9) She remains on ASA 81mg /d...  Problem # 3:  HYPERLIPIDEMIA (ICD-272.4) Last FLP looked good on diet + fish oil, etc...  Problem # 4:  GI>>> She has mult GI problems> GERD, dysphagia,  Divertics, IBS, etc... all followed by DrGessner.  Problem # 5:  CYSTOCELE WITH INCOMPLETE UTERINE PROLAPSE (ICD-618.2) GU & GYN per DrSilva et al w/ pessary Rx, but pt removed it due to constipation...  Problem # 6:  FIBROMYALGIA (ICD-729.1) Now followed by DrProchnau in [place] & pt is very happy w/ her care... Her updated medication list for this problem includes:    Adult Aspirin Ec Low Strength 81 Mg Tbec (Aspirin) ..... Once daily  Problem # 7:  OSTEOPOROSIS (ICD-733.00) Data sent to Rheum- DrProchnau in [place] & she will consider additional therapy...  Problem # 8:  TREMOR (ICD-781.0) Followed by DrReynolds for Neuro...  Problem # 9:  OTHER MEDICAL PROBLEMS AS NOTED>>>  Complete Medication List: 1)  Adult Aspirin Ec Low Strength 81 Mg Tbec (Aspirin) .... Once daily 2)  Eql Fish Oil 1000 Mg Caps (Omega-3 fatty acids) .... Take 1 tablet by mouth once a day 3)  Coq10 100 Mg Caps (Coenzyme q10) .... Take 1 capsule by mouth three times a day 4)  Amitriptyline Hcl 50 Mg Tabs (Amitriptyline hcl) .... Take 1 tab by mouth at bedtime.Marland KitchenMarland Kitchen 5)  Alprazolam 0.5 Mg Tabs (Alprazolam) .... Take 1/2 to 1 tab  by mouth three times a day as directed... 6)  Multivitamins Tabs (Multiple vitamin) .... Take 1 tablet by mouth once a day 7)  Calcium 500 Mg Tabs (Calcium) .... Take 1 tablet by mouth once daily 8)  Manganese Gluconate 50 Mg Tabs (Manganese gluconate) .... Take 1 tablet by  mouth two times a day 9)  Vitamin B-1 100 Mg Tabs (Thiamine hcl) .... Take 1 tablet by mouth once daily 10)  Vitamin D3 1000 Unit Caps (Cholecalciferol) .... Take 1 cap by mouth once daily... 11)  Vitamin E 400 Unit Caps (Vitamin e) .... Take 1 capsule by mouth once a day 12)  Vit B-12 Shots  .... Admin as directed by drprochnau  Patient Instructions: 1)  Today we updated your med list- see below.... 2)  Continue your current meds the same... 3)  Be sure to discuss your OSTEOPOROSIS w/ DrProchnau... 4)  We will be sure that she has all of your [facility] records to review.Marland KitchenMarland Kitchen 5)  Call for any questions.Marland KitchenMarland Kitchen

## 2010-03-28 NOTE — Progress Notes (Signed)
Summary: pt wants samples  Phone Note Call from Patient Call back at Home Phone [phone]   Caller: Patient Call For: nadel Summary of Call: patient wants samples of savella, she cant afford them anymore.  Initial call taken by: Valinda Hoar,  April 16, 2009 12:20 PM  Follow-up for Phone Call        no samples available, pt does not want to switch to a generic she states this med is working well for her and she is going to stay on it for now, will check with several pharmacies and see where she can get it cheaper Follow-up by: Philipp Deputy CMA,  April 16, 2009 3:03 PM

## 2010-03-28 NOTE — Letter (Signed)
Summary: [facility] Instructions  [facility]  [address] [place], [place] [address]   Phone: [phone]  Fax: [phone]       PAMLA PANGLE    [birth year]    MRN: [id number]      Procedure Day Dorna Bloom: Lenor Coffin, 11/29/09     Arrival Time: 2:00 PM      Procedure Time: 3:00 PM    Location of Procedure:                    _X_  [facility] (4th Floor)                    PREPARATION FOR COLONOSCOPY WITH MOVIPREP   Starting 5 days prior to your procedure 11/24/09 do not eat nuts, seeds, popcorn, corn, beans, peas,  salads, or any raw vegetables.  Do not take any fiber supplements (e.g. Metamucil, Citrucel, and Benefiber).  THE DAY BEFORE YOUR PROCEDURE         WEDNESDAY, 11/28/09  1.  Drink clear liquids the entire day-NO SOLID FOOD  2.  Do not drink anything colored red or purple.  Avoid juices with pulp.  No orange juice.  3.  Drink at least 64 oz. (8 glasses) of fluid/clear liquids during the day to prevent dehydration and help the prep work efficiently.  CLEAR LIQUIDS INCLUDE: Water Jello Ice Popsicles Tea (sugar ok, no milk/cream) Powdered fruit flavored drinks Coffee (sugar ok, no milk/cream) Gatorade Juice: apple, white grape, white cranberry  Lemonade Clear bullion, consomm, broth Carbonated beverages (any kind) Strained chicken noodle soup Hard Candy                          4.  In the morning, mix first dose of MoviPrep solution:    Empty 1 Pouch A and 1 Pouch B into the disposable container    Add lukewarm drinking water to the top line of the container. Mix to dissolve    Refrigerate (mixed solution should be used within 24 hrs)  5.  Begin drinking the prep at 5:00 p.m. The MoviPrep container is divided by 4 marks.   Every 15 minutes drink the solution down to the next mark (approximately 8 oz) until the full liter is complete.   6.  Follow completed prep with 16 oz of clear liquid of your choice (Nothing red or purple).   Continue to drink clear liquids until bedtime.  7.  Before going to bed, mix second dose of MoviPrep solution:    Empty 1 Pouch A and 1 Pouch B into the disposable container    Add lukewarm drinking water to the top line of the container. Mix to dissolve    Refrigerate  THE DAY OF YOUR PROCEDURE      THURSDAY, 11/29/09  Beginning at 10:00 a.m. (5 hours before procedure):         1. Every 15 minutes, drink the solution down to the next mark (approx 8 oz) until the full liter is complete.  2. Follow completed prep with 16 oz. of clear liquid of your choice.    3. You may drink clear liquids until 1 PM (2 HOURS BEFORE PROCEDURE).  MEDICATION INSTRUCTIONS  Unless otherwise instructed, you should take regular prescription medications with a small sip of water   as early as possible the morning of your procedure.       OTHER INSTRUCTIONS  You will need a responsible  adult at least 75 years of age to accompany you and drive you home.   This person must remain in the waiting room during your procedure.  Wear loose fitting clothing that is easily removed.  Leave jewelry and other valuables at home.  However, you may wish to bring a book to read or  an iPod/MP3 player to listen to music as you wait for your procedure to start.  Remove all body piercing jewelry and leave at home.  Total time from sign-in until discharge is approximately 2-3 hours.  You should go home directly after your procedure and rest.  You can resume normal activities the  day after your procedure.  The day of your procedure you should not:   Drive   Make legal decisions   Operate machinery   Drink alcohol   Return to work  You will receive specific instructions about eating, activities and medications before you leave.   The above instructions have been reviewed and explained to me by   Francee Piccolo, CMA (AAMA)    I fully understand and can verbalize these instructions  _____________________________ Date _________

## 2010-03-28 NOTE — Assessment & Plan Note (Signed)
Summary: problems w/bowels--ch.    History of Present Illness Visit Type: Follow-up Visit Primary GI MD: Stan Head MD [employer] Primary Provider: Philemon Kingdom, MD Chief Complaint: Patient c/o constipation and dark black stool. History of Present Illness:   77 yo ww last seen in Spring 2011. Describes a lot of flatus and she cannot control it. Tjis is embarrasing. She is constipated with defecation every other day instead of once daily. Several weeks ago se went a few days without bowel moveent and she said every part was black. Denies Pepto-Bismol use. She is using prunes mostly to help defecation and is using fiber. Slippery elm herb also used sparingly. Sometimes will have narrow stools.  Swallowing is about the same but is having difficulty swallowing some pills. Also some difficulty swallows liquids. She will stop eating and use a cough drop. Granddaughter Banker) gives history here. Eating alone and in a quiet place helps.   GI Review of Systems    Reports acid reflux, bloating, dysphagia with liquids, and  heartburn.      Denies abdominal pain, belching, chest pain, dysphagia with solids, loss of appetite, nausea, vomiting, vomiting blood, weight loss, and  weight gain.      Reports black tarry stools, change in bowel habits, constipation, diverticulosis, and  hemorrhoids.     Denies anal fissure, diarrhea, fecal incontinence, heme positive stool, irritable bowel syndrome, jaundice, light color stool, liver problems, rectal bleeding, and  rectal pain. Clinical Reports Reviewed:  Colonoscopy:  10/01/2004:  Results: Hemorrhoids.     Results: Diverticulosis.       Location:  [facility].   EGD:  12/05/2003:  Findings: Gastritis  Location: [facility]      Current Medications (verified): 1)  Adult Aspirin Ec Low Strength 81 Mg  Tbec (Aspirin) .... Once Daily 2)  Eql Fish Oil 1000 Mg  Caps (Omega-3 Fatty Acids) .... Take 1 Tablet By Mouth Two Times  A Day 3)  Coq10 100 Mg  Caps (Coenzyme Q10) .... Take 1 Capsule By Mouth Two Times A Day 4)  Amitriptyline Hcl 50 Mg Tabs (Amitriptyline Hcl) .... Take 1 Tab By Mouth At Bedtime.Marland KitchenMarland Kitchen 5)  Alprazolam 0.5 Mg Tabs (Alprazolam) .... Take 1/2 To 1 Tab  By Mouth Three Times A Day As Directed... 6)  Multivitamins   Tabs (Multiple Vitamin) .... Take 1 Tablet By Mouth Once A Day 7)  Oystercal-D 500-400 Mg-Unit Tabs (Calcium-Vitamin D) .... Take 1 Tablet By Mouth Two Times A Day 8)  Manganese Gluconate 50 Mg Tabs (Manganese Gluconate) .... Take 1 Tablet By Mouth Two Times A Day 9)  Vitamin B-1 100 Mg Tabs (Thiamine Hcl) .... Take 1 Tablet By Mouth Once Daily 10)  Vitamin D 400 Unit Tabs (Cholecalciferol) .... Take 1 Tablet By Mouth Two Times A Day 11)  Vitamin E 400 Unit Caps (Vitamin E) .... Take 1 Capsule By Mouth Once A Day 12)  Vit B-12 Shots .... Admin As Directed By Drprochnau  Allergies: 1)  ! Pcn 2)  ! Codeine 3)  ! Effexor 4)  ! Lexapro 5)  ! Bisphosphonates 6)  ! Amada Jupiter  Past History:  Past Medical History: Reviewed history from 11/01/2009 and no changes required. MITRAL VALVE PROLAPSE (ICD-424.0) PERIPHERAL VASCULAR DISEASE (ICD-443.9) CEREBROVASCULAR DISEASE (ICD-437.9) HYPERLIPIDEMIA (ICD-272.4) UNDERWEIGHT (ICD-783.22) Hx of GASTRITIS (ICD-535.50) DIVERTICULOSIS OF COLON (ICD-562.10) IRRITABLE BOWEL SYNDROME (ICD-564.1) FIBROCYSTIC BREAST DISEASE (ICD-610.1) CYSTOCELE WITH INCOMPLETE UTERINE PROLAPSE (ICD-618.2) FIBROMYALGIA (ICD-729.1) OSTEOPOROSIS (ICD-733.00) TREMOR (ICD-781.0) STRESS DISORDER (ICD-V62.89)  Past Surgical History: S/P cystocele repair Appendectomy Right breast lumpectomy Rupture around naval  Family History: mother died age 85 from bright's disease father died age 46 from pneumonia 2 siblings 1 died age 72 from cancer 1 alive age 7 Family History of Breast Cancer: Sister No FH of Colon Cancer: Family History of Colon Polyps:Sister Family  History of Ovarian Cancer: Sister Family History of Diabetes: Son, Daughter, Maternal Uncle x 3, Maternal Aunt, Cousins, sister  Social History: retired never smoked re-married--james Puff 3 children Illicit Drug Use - no Patient does not get regular exercise.  Alcohol Use - no  Vital Signs:  Patient profile:   77 year old female Height:      66 inches Weight:      113.44 pounds BMI:     18.38 BSA:     1.57 Pulse rate:   80 / minute Pulse rhythm:   regular BP sitting:   110 / 62  (left arm)  Vitals Entered By: Lamona Curl CMA Duncan Dull) (November 05, 2009 8:35 AM)  Physical Exam  General:  thin, mildly chronically ill and underweight-appearng Eyes:  PERRLA, no icterus. Mouth:  No deformity or lesions,  Chest Wall:  kyphosis.   Lungs:  Clear throughout to auscultation. Heart:  Regular rate and rhythm; no murmurs, rubs,  or bruits. Abdomen:  soft, scaphoid nontender BS+ no HSM/mass Rectal:  deferred until time of colonoscopy.   Psych:  slightly flat but appropriate   Impression & Recommendations:  Problem # 1:  CHANGE IN BOWELS (YNW-295.62) Assessment Deteriorated  Worsening constipation. Some narrow stools. IBS likely but with age and last colonoscopy 5 years ago will reassess with colonoscopy. Risks, benefits,and indications of endoscopic procedure(s) were reviewed with the patient and all questions answered.  Orders: Colonoscopy (Colon)  Problem # 2:  OTHER DYSPHAGIA (ICD-787.29) Assessment: Unchanged ? somewhat worse MBS was ok though she did aspirate with Ba swallow. Anxiety is related.  Monitor for now and take precautions..  Patient Instructions: 1)  Please pick up your medications at your pharmacy.  2)  We will see you at your procedure on 11/29/09 3)  [facility] Patient Information Guide given to patient.  4)  Colonoscopy and Flexible Sigmoidoscopy brochure given.  5)  Copy sent to : Philemon Kingdom, MD 6)  The medication  list was reviewed and reconciled.  All changed / newly prescribed medications were explained.  A complete medication list was provided to the patient / caregiver. Prescriptions: MOVIPREP 100 GM  SOLR (PEG-KCL-[id number]-NA ASC-C) As per prep instructions.  #1 x 0   Entered by:   Francee Piccolo CMA (AAMA)   Authorized by:   Iva Boop MD, [facility]   Signed by:   Francee Piccolo CMA (AAMA) on 11/05/2009   Method used:   Electronically to        [facility]. [phone]* (retail)       [address]       [place], [place]  [address]       Ph: [phone] or [phone]       Fax: [phone]   RxID:   [id number]

## 2010-03-28 NOTE — Progress Notes (Signed)
  Phone Note Other Incoming   Request: Send information Summary of Call: Request for records received from Dr. Philemon Kingdom, [facility] Request forwarded to [facility].

## 2010-03-28 NOTE — Assessment & Plan Note (Signed)
Summary: dysphagia--ch.    History of Present Illness Visit Type: Initial Consult Primary GI MD: Stan Head MD [facility] Primary Provider: Alroy Dust, MD Chief Complaint: Patient c/o dysphagia and difficutly with constipation. History of Present Illness:   Patient c/o 2 years worsening dysphagia to solids and fluids. She states that the dysphagia becomes better when she is by herself, in a "quiet place." She denies any odynophagia or any chest discomfort. She does have some bloating which has become more prominent x 2 months. She also c/o 2 months constipation as well as some rectal discomfort. She attributes this to taking Savella (she no longer takes this). She denies any blood in the stool. Constipation is still there despite stopping it. Her Pesary become displaced when she gotconstipated, she removed it and had it replaced by Dr. Edward Jolly. She took it back out because of recurent cnstipation. She thinks she is better overall but still somewhat constipated. Also passing alot of flatus - that is new.  Daughter is here and provides some history.   GI Review of Systems    Reports bloating, dysphagia with liquids, and  dysphagia with solids.      Denies abdominal pain, acid reflux, belching, chest pain, heartburn, loss of appetite, nausea, vomiting, vomiting blood, weight loss, and  weight gain.      Reports constipation, hemorrhoids, and  rectal pain.     Denies anal fissure, black tarry stools, change in bowel habit, diarrhea, diverticulosis, fecal incontinence, heme positive stool, irritable bowel syndrome, jaundice, light color stool, liver problems, and  rectal bleeding. Preventive Screening-Counseling & Management  Caffeine-Diet-Exercise     Does Patient Exercise: no      Drug Use:  no.      Current Medications (verified): 1)  Adult Aspirin Ec Low Strength 81 Mg  Tbec (Aspirin) .... Once Daily 2)  Eql Fish Oil 1000 Mg  Caps (Omega-3 Fatty Acids) .... Take 1 Tablet By Mouth Once A  Day 3)  Coq10 100 Mg  Caps (Coenzyme Q10) .... Take 1 Capsule By Mouth Three Times A Day 4)  Amitriptyline Hcl 25 Mg  Tabs (Amitriptyline Hcl) .... Take 1 Tab By Mouth At Bedtime.Marland KitchenMarland Kitchen 5)  Alprazolam 0.5 Mg Tabs (Alprazolam) .... Take 1/2 To 1 Tab  By Mouth Three Times A Day As Directed... 6)  Multivitamins   Tabs (Multiple Vitamin) .... Take 1 Tablet By Mouth Once A Day 7)  Savella 50 Mg Tabs (Milnacipran Hcl) .... Wean The Savella To One Tab Daily Til Gone- Then Stop. 8)  Calcium 500 Mg Tabs (Calcium) .... Take 1 Tablet By Mouth Once Daily 9)  Manganese Gluconate 50 Mg Tabs (Manganese Gluconate) .... Take 1 Tablet By Mouth Two Times A Day 10)  Vitamin E 400 Unit Caps (Vitamin E) .... Take 1 Capsule By Mouth Once A Day 11)  Vitamin B-1 100 Mg Tabs (Thiamine Hcl) .... Take 1 Tablet By Mouth Once Daily 12)  Vitamin D 500 Mg .... Take 1 Tablet By Mouth Two Times A Day  Allergies: 1)  ! Pcn 2)  ! Codeine 3)  ! Effexor 4)  ! Lexapro 5)  ! Bisphosphonates 6)  ! Amada Jupiter  Past History:  Past Medical History: Reviewed history from 05/03/2009 and no changes required.  MITRAL VALVE PROLAPSE (ICD-424.0) PERIPHERAL VASCULAR DISEASE (ICD-443.9) CEREBROVASCULAR DISEASE (ICD-437.9) HYPERLIPIDEMIA (ICD-272.4) UNDERWEIGHT (ICD-783.22) Hx of GASTRITIS (ICD-535.50) DIVERTICULOSIS OF COLON (ICD-562.10) IRRITABLE BOWEL SYNDROME (ICD-564.1) FIBROCYSTIC BREAST DISEASE (ICD-610.1) CYSTOCELE WITH INCOMPLETE UTERINE PROLAPSE (ICD-618.2) FIBROMYALGIA (ICD-729.1)  OSTEOPOROSIS (ICD-733.00) TREMOR (ICD-781.0) STRESS DISORDER (ICD-V62.89)  Past Surgical History: S/P cystocele repair Appendectomy  Family History: mother died age 69 from bright's disease father died age 87 from pneumonia 2 siblings 1 died age 28 from cancer 1 alive age 35 Family History of Breast Cancer: Sister No FH of Colon Cancer: Family History of Colon Polyps:Sister Family History of Ovarian Cancer: Sister Family History of  Diabetes: Son, Daughter, Maternal Uncle x 3, Maternal Aunt, Cousins  Social History: retired never smoked re-married--james Frayne 3 children Illicit Drug Use - no Patient does not get regular exercise.  Drug Use:  no Does Patient Exercise:  no  Review of Systems       The patient complains of allergy/sinus, confusion, cough, fatigue, headaches-new, and hearing problems.         Much of this was related to [facility] use All other ROS negative except as per HPI.   Vital Signs:  Patient profile:   77 year old female Height:      66 inches Weight:      104.13 pounds BMI:     16.87 BSA:     1.52 Pulse rate:   76 / minute Pulse rhythm:   regular BP sitting:   128 / 62  (left arm)  Vitals Entered By: Hortense Ramal CMA Duncan Dull) (June 07, 2009 10:42 AM)  Physical Exam  General:  thin, mildly chronically ill and underweight-appearng Eyes:  anicteric Mouth:  No deformity or lesions,  Neck:  Supple; no masses or thyromegaly. Chest Wall:  kyphosis.   Lungs:  Clear throughout to auscultation. Heart:  Regular rate and rhythm; no murmurs, rubs,  or bruits. Abdomen:  soft, scaphoid nontender BS+ no HSM/mass Extremities:  No clubbing, cyanosis, edema or deformities noted. Neurologic:  Alert and  oriented x4;  grossly normal neurologically. Cervical Nodes:  No significant cervical or supraclavicular adenopathy.  Psych:  slightly flat bt appropriate   Impression & Recommendations:  Problem # 1:  OTHER DYSPHAGIA (ICD-787.29) Assessment Comment Only NEW for me gets relief when she is alone eating with husband (Alzheimer's) is stresssful she eats slow and worries about slowing others down overall sounds like it is induced by anxiety start with barium swallow evaluation  Problem # 2:  CHANGE IN BOWELS (ICD-787.99) seems related to [facility] but not clear that it is resolved though better since starting check celiac abs before anything else consider colonscpy pending Ba swallow  and these labs March 2011 TSH ok as is CBC, CMET  Orders: T-Tissue Transglutamase Ab IgA [phone]) T-Sprue Panel (Celiac Disease Aby Eval) (83516x3/[address]) TLB-IgA (Immunoglobulin A) (82784-IGA)  Problem # 3:  IRRITABLE BOWEL SYNDROME (ICD-564.1) Assessment: Comment Only she clearly has this, ? how much of current problems are related  Patient Instructions: 1)  Go to the basement for you labs today. 2)  You have been scheduled for a Barrium Swallow at [facility] on 06/12/2009 please arrive at 8:45am and have nothing to eat or drink after midnight. 3)  Celiac Sprue brochure given.  4)  Copy sent to : Alroy Dust, MD & Anne Shutter, MD 5)  The medication list was reviewed and reconciled.  All changed / newly prescribed medications were explained.  A complete medication list was provided to the patient / caregiver.

## 2010-03-28 NOTE — Procedures (Signed)
Summary: EGD: Dr. Doreatha Martin   EGD  Procedure date:  12/05/2003  Findings:      Findings: Gastritis  Location: [facility]   NEG RUT  Patient Name: Kelsey, Griffin. MRN: [id number] Procedure Procedures: Panendoscopy (EGD) CPT: 43235.    with biopsy(s)/brushing(s). CPT: D1846139.  Personnel: Endoscopist: Ulyess Mort, MD.  Referred By: Lonzo Cloud. Kriste Basque, MD.  Exam Location: Exam performed in Outpatient Clinic. Outpatient  Patient Consent: Procedure, Alternatives, Risks and Benefits discussed, consent obtained, from patient. Consent was obtained by the RN.  Indications Symptoms: Nausea. Abdominal pain,  History  Current Medications: Patient is not currently taking Coumadin.  Pre-Exam Physical: Entire physical exam was normal.  Exam Exam Info: Maximum depth of insertion Duodenum, intended Duodenum. Vocal cords visualized. Gastric retroflexion performed. Images were not taken. ASA Classification: II. Tolerance: good.  Sedation Meds: Patient assessed and found to be appropriate for moderate (conscious) sedation. Fentanyl 25 mcg. given IV. Versed 5 mg. given IV. Cetacaine Spray 2 sprays given aerosolized.  Monitoring: BP and pulse monitoring done. Oximetry used. Supplemental O2 given  Findings - Normal: Proximal Esophagus to Distal Esophagus.  - Normal: Pyloric Sphincter to Duodenal 2nd Portion.  - MUCOSAL ABNORMALITY: Body to Antrum. Nodularity present. Erythematous mucosa. Granular mucosa. Biopsy/Mucosal Abn taken. RUT done, results pending. ICD9: Gastritis, Chronic: 535.10.   Assessment  Diagnoses: 535.10: Gastritis, Chronic.   Events  Unplanned Intervention: No unplanned interventions were required.  Unplanned Events: There were no complications. Plans Medication(s): Await pathology. Continue current medications.  Patient Education: Patient given standard instructions for: Mucosal Abnormality.  Disposition: After procedure patient sent to  recovery. After recovery patient sent home.   CC:   Alroy Dust, MD  This report was created from the original endoscopy report, which was reviewed and signed by the above listed endoscopist.

## 2010-03-28 NOTE — Progress Notes (Signed)
Summary: prescript  Phone Note Call from Patient   Caller: Patient Call For: nadel Summary of Call: pt need prescript for amaitripyline for 90 days supply. need referral to see dr Casimiro Needle in [place] for fibromyalgia, pharmacy [facility] [place] Initial call taken by: Rickard Patience,  May 31, 2009 11:21 AM  Follow-up for Phone Call        [facility] to ask about what type of doctor this is. Carron Curie CMA  May 31, 2009 11:39 AM  Spoke with pt.  Pt states she was told by SN once she finished Savella she should see Dr. Glennis Brink ( internal medicine).  Pt states she has been off this med for 11 days and requesting a referral to Dr. Glennis Brink.  Will forward to SN - pls advise.  Thanks! Gweneth Dimitri RN  May 31, 2009 3:40 PM     Prescriptions: AMITRIPTYLINE HCL 25 MG  TABS (AMITRIPTYLINE HCL) take 1 tab by mouth at bedtime...  #90 x 2   Entered by:   Carron Curie CMA   Authorized by:   Michele Mcalpine MD   Signed by:   Carron Curie CMA on 05/31/2009   Method used:   Electronically to        [facility]. [phone]* (retail)       [address]       [place], [place]  [address]       Ph: [phone] or [phone]       Fax: [phone]   RxID:   [id number]

## 2010-03-28 NOTE — Procedures (Signed)
Summary: Colonoscopy  Patient: Kavya Haag Note: All result statuses are Final unless otherwise noted.  Tests: (1) Colonoscopy (COL)   COL Colonoscopy           DONE     [facility]     [address]     [place], [place]  [address]           COLONOSCOPY PROCEDURE REPORT           PATIENT:  Barbarann, Kelly  MR#:  [id number]     BIRTHDATE:  [birth year], 77 yrs. old  GENDER:  female     ENDOSCOPIST:  Iva Boop, MD, [facility]           PROCEDURE DATE:  11/29/2009     PROCEDURE:  Colonoscopy 19147     ASA CLASS:  Class II     INDICATIONS:  change in bowel habits     MEDICATIONS:   Fentanyl 50 mcg, Versed 4 mg           DESCRIPTION OF PROCEDURE:   After the risks benefits and     alternatives of the procedure were thoroughly explained, informed     consent was obtained.  Digital rectal exam was performed and     revealed no rectal masses.  Pesary palpable through anterior     proximal rectal wall. The LB [id number] endoscope was     introduced through the anus and advanced to the cecum, which was     identified by both the appendix and ileocecal valve, without     limitations.  The quality of the prep was excellent, using     MoviPrep.  The instrument was then slowly withdrawn as the colon     was fully examined. Insertion: 7:24 minutes Withdrawal: 10:15     minutes     <<PROCEDUREIMAGES>>           FINDINGS:  Severe diverticulosis was found in the left colon.     This was otherwise a normal examination of the colon.   Retroflexed     views in the rectum revealed internal hemorrhoids.    The scope     was then withdrawn from the patient and the procedure completed.           COMPLICATIONS:  None     ENDOSCOPIC IMPRESSION:     1) Severe diverticulosis in the left colon - with luminal     stenosis - responsible for constipation and narrow stools     2) Otherwise normal examination of the colon     3) Internal hemorrhoids in the rectum     RECOMMENDATIONS:  1) take Align probiotic (purchase over the counter) 1 each day     for 1 month at least. If it helps gas continue.     2) Psyllium (fiber supplement - generic Metamucil) 1 tablespoon     daily and increase fiber in your diet to improve constipation. Try     to be as physically active as possible also.     3) if this does not provide adequate help for your problems     after 2-3 months then return to Dr. Hazeline Junker EXAM:  In for as needed.           Iva Boop, MD, [facility]           CC:  The Patient  n.     eSIGNED:   Iva Boop at 11/29/2009 03:41 PM           Bayard Beaver, [id number]  Note: An exclamation mark (!) indicates a result that was not dispersed into the flowsheet. Document Creation Date: 11/29/2009 3:41 PM _______________________________________________________________________  (1) Order result status: Final Collection or observation date-time: 11/29/2009 15:30 Requested date-time:  Receipt date-time:  Reported date-time:  Referring Physician:   Ordering Physician: Stan Head [phone]) Specimen Source:  Source: Launa Grill Order Number: [id number] Lab site:

## 2010-03-28 NOTE — Procedures (Signed)
Summary: Colonoscopy: Dr. Doreatha Martin: Diverticulosis, Hemorrhoids   Colonoscopy  Procedure date:  10/01/2004  Findings:      Results: Hemorrhoids.     Results: Diverticulosis.       Location:  [facility].    Procedures Next Due Date:    Colonoscopy: 09/2011  Patient Name: Kelsey Griffin, Kelsey Griffin. MRN: [id number] Procedure Procedures: Colonoscopy CPT: [phone].  Personnel: Endoscopist: Ulyess Mort, MD.  Exam Location: Exam performed in Outpatient Clinic. Outpatient  Patient Consent: Procedure, Alternatives, Risks and Benefits discussed, consent obtained, from patient. Consent was obtained by the RN.  Indications  Average Risk Screening Routine.  History  Current Medications: Patient is not currently taking Coumadin.  Pre-Exam Physical: Entire physical exam was normal.  Exam Exam: Extent of exam reached: Cecum, extent intended: Cecum.  The cecum was identified by appendiceal orifice and IC valve. Colon retroflexion performed. Images were not taken. ASA Classification: II. Tolerance: good.  Monitoring: Pulse and BP monitoring, Oximetry used. Supplemental O2 given.  Colon Prep Prep results: good.  Sedation Meds: Patient assessed and found to be appropriate for moderate (conscious) sedation. Fentanyl 50 mcg. given IV. Versed 7 mg. given IV.  Findings - DIVERTICULOSIS: Ascending Colon to Sigmoid Colon. ICD9: Diverticulosis: 562.10. Comments: mild scattered .  - HEMORRHOIDS: Internal. Size: Grade I. ICD9: Hemorrhoids, Internal: 455.0.   Assessment Abnormal examination, see findings above.  Diagnoses: 562.10: Diverticulosis.  455.0: Hemorrhoids, Internal.   Events  Unplanned Interventions: No intervention was required.  Unplanned Events: There were no complications. Plans Medication Plan: Continue current medications.  Patient Education: Patient given standard instructions for: Diverticulosis. Hemorrhoids. Yearly hemoccult testing recommended.  Patient instructed to get routine colonoscopy every 7 years.  Disposition: After procedure patient sent to recovery. After recovery patient sent home.   CC:   Alroy Dust, MD  This report was created from the original endoscopy report, which was reviewed and signed by the above listed endoscopist.

## 2010-09-02 ENCOUNTER — Other Ambulatory Visit: Payer: Self-pay | Admitting: Obstetrics and Gynecology

## 2010-09-02 DIAGNOSIS — Z1231 Encounter for screening mammogram for malignant neoplasm of breast: Secondary | ICD-10-CM

## 2010-09-26 ENCOUNTER — Ambulatory Visit: Payer: Self-pay

## 2011-03-06 DIAGNOSIS — M5126 Other intervertebral disc displacement, lumbar region: Secondary | ICD-10-CM | POA: Diagnosis not present

## 2011-03-06 DIAGNOSIS — M999 Biomechanical lesion, unspecified: Secondary | ICD-10-CM | POA: Diagnosis not present

## 2011-03-11 ENCOUNTER — Ambulatory Visit
Admission: RE | Admit: 2011-03-11 | Discharge: 2011-03-11 | Disposition: A | Discharge status: Home / Self Care | Payer: Medicare Other | Source: Ambulatory Visit | Attending: Obstetrics and Gynecology | Admitting: Obstetrics and Gynecology

## 2011-03-11 ENCOUNTER — Other Ambulatory Visit: Payer: Self-pay | Admitting: Obstetrics and Gynecology

## 2011-03-11 DIAGNOSIS — Z1231 Encounter for screening mammogram for malignant neoplasm of breast: Secondary | ICD-10-CM

## 2011-03-13 DIAGNOSIS — M999 Biomechanical lesion, unspecified: Secondary | ICD-10-CM | POA: Diagnosis not present

## 2011-03-13 DIAGNOSIS — M5126 Other intervertebral disc displacement, lumbar region: Secondary | ICD-10-CM | POA: Diagnosis not present

## 2011-03-26 DIAGNOSIS — N390 Urinary tract infection, site not specified: Secondary | ICD-10-CM | POA: Diagnosis not present

## 2011-03-26 DIAGNOSIS — R209 Unspecified disturbances of skin sensation: Secondary | ICD-10-CM | POA: Diagnosis not present

## 2011-03-26 DIAGNOSIS — M549 Dorsalgia, unspecified: Secondary | ICD-10-CM | POA: Diagnosis not present

## 2011-03-26 DIAGNOSIS — E119 Type 2 diabetes mellitus without complications: Secondary | ICD-10-CM | POA: Diagnosis not present

## 2011-04-03 DIAGNOSIS — N390 Urinary tract infection, site not specified: Secondary | ICD-10-CM | POA: Diagnosis not present

## 2011-04-15 DIAGNOSIS — M5126 Other intervertebral disc displacement, lumbar region: Secondary | ICD-10-CM | POA: Diagnosis not present

## 2011-04-15 DIAGNOSIS — M999 Biomechanical lesion, unspecified: Secondary | ICD-10-CM | POA: Diagnosis not present

## 2011-04-24 DIAGNOSIS — F329 Major depressive disorder, single episode, unspecified: Secondary | ICD-10-CM | POA: Diagnosis not present

## 2011-04-24 DIAGNOSIS — IMO0001 Reserved for inherently not codable concepts without codable children: Secondary | ICD-10-CM | POA: Diagnosis not present

## 2011-04-24 DIAGNOSIS — R300 Dysuria: Secondary | ICD-10-CM | POA: Diagnosis not present

## 2011-04-29 DIAGNOSIS — M999 Biomechanical lesion, unspecified: Secondary | ICD-10-CM | POA: Diagnosis not present

## 2011-04-29 DIAGNOSIS — M5126 Other intervertebral disc displacement, lumbar region: Secondary | ICD-10-CM | POA: Diagnosis not present

## 2011-06-03 DIAGNOSIS — M5126 Other intervertebral disc displacement, lumbar region: Secondary | ICD-10-CM | POA: Diagnosis not present

## 2011-06-03 DIAGNOSIS — M999 Biomechanical lesion, unspecified: Secondary | ICD-10-CM | POA: Diagnosis not present

## 2011-06-19 DIAGNOSIS — H26499 Other secondary cataract, unspecified eye: Secondary | ICD-10-CM | POA: Diagnosis not present

## 2011-06-19 DIAGNOSIS — H401290 Low-tension glaucoma, unspecified eye, stage unspecified: Secondary | ICD-10-CM | POA: Diagnosis not present

## 2011-06-19 DIAGNOSIS — H43399 Other vitreous opacities, unspecified eye: Secondary | ICD-10-CM | POA: Diagnosis not present

## 2011-06-19 DIAGNOSIS — H409 Unspecified glaucoma: Secondary | ICD-10-CM | POA: Diagnosis not present

## 2011-06-19 DIAGNOSIS — H4720 Unspecified optic atrophy: Secondary | ICD-10-CM | POA: Diagnosis not present

## 2011-07-24 DIAGNOSIS — R5383 Other fatigue: Secondary | ICD-10-CM | POA: Diagnosis not present

## 2011-07-24 DIAGNOSIS — R7989 Other specified abnormal findings of blood chemistry: Secondary | ICD-10-CM | POA: Diagnosis not present

## 2011-07-24 DIAGNOSIS — E538 Deficiency of other specified B group vitamins: Secondary | ICD-10-CM | POA: Diagnosis not present

## 2011-07-24 DIAGNOSIS — R0602 Shortness of breath: Secondary | ICD-10-CM | POA: Diagnosis not present

## 2011-07-24 DIAGNOSIS — R5381 Other malaise: Secondary | ICD-10-CM | POA: Diagnosis not present

## 2011-08-05 DIAGNOSIS — R0602 Shortness of breath: Secondary | ICD-10-CM | POA: Diagnosis not present

## 2011-08-14 DIAGNOSIS — N390 Urinary tract infection, site not specified: Secondary | ICD-10-CM | POA: Diagnosis not present

## 2011-08-14 DIAGNOSIS — M5126 Other intervertebral disc displacement, lumbar region: Secondary | ICD-10-CM | POA: Diagnosis not present

## 2011-08-14 DIAGNOSIS — F411 Generalized anxiety disorder: Secondary | ICD-10-CM | POA: Diagnosis not present

## 2011-08-14 DIAGNOSIS — M999 Biomechanical lesion, unspecified: Secondary | ICD-10-CM | POA: Diagnosis not present

## 2011-08-19 DIAGNOSIS — D649 Anemia, unspecified: Secondary | ICD-10-CM | POA: Diagnosis not present

## 2011-08-26 DIAGNOSIS — M999 Biomechanical lesion, unspecified: Secondary | ICD-10-CM | POA: Diagnosis not present

## 2011-08-26 DIAGNOSIS — M5126 Other intervertebral disc displacement, lumbar region: Secondary | ICD-10-CM | POA: Diagnosis not present

## 2011-09-02 DIAGNOSIS — M5126 Other intervertebral disc displacement, lumbar region: Secondary | ICD-10-CM | POA: Diagnosis not present

## 2011-09-02 DIAGNOSIS — M999 Biomechanical lesion, unspecified: Secondary | ICD-10-CM | POA: Diagnosis not present

## 2011-09-16 DIAGNOSIS — M999 Biomechanical lesion, unspecified: Secondary | ICD-10-CM | POA: Diagnosis not present

## 2011-09-16 DIAGNOSIS — M5126 Other intervertebral disc displacement, lumbar region: Secondary | ICD-10-CM | POA: Diagnosis not present

## 2011-10-07 DIAGNOSIS — M999 Biomechanical lesion, unspecified: Secondary | ICD-10-CM | POA: Diagnosis not present

## 2011-10-07 DIAGNOSIS — M5126 Other intervertebral disc displacement, lumbar region: Secondary | ICD-10-CM | POA: Diagnosis not present

## 2011-10-28 DIAGNOSIS — M999 Biomechanical lesion, unspecified: Secondary | ICD-10-CM | POA: Diagnosis not present

## 2011-10-28 DIAGNOSIS — M5126 Other intervertebral disc displacement, lumbar region: Secondary | ICD-10-CM | POA: Diagnosis not present

## 2011-11-25 DIAGNOSIS — R05 Cough: Secondary | ICD-10-CM | POA: Diagnosis not present

## 2011-11-25 DIAGNOSIS — R5383 Other fatigue: Secondary | ICD-10-CM | POA: Diagnosis not present

## 2011-11-25 DIAGNOSIS — R059 Cough, unspecified: Secondary | ICD-10-CM | POA: Diagnosis not present

## 2011-11-25 DIAGNOSIS — J438 Other emphysema: Secondary | ICD-10-CM | POA: Diagnosis not present

## 2011-11-25 DIAGNOSIS — R5381 Other malaise: Secondary | ICD-10-CM | POA: Diagnosis not present

## 2011-11-25 DIAGNOSIS — R9389 Abnormal findings on diagnostic imaging of other specified body structures: Secondary | ICD-10-CM | POA: Diagnosis not present

## 2011-11-25 DIAGNOSIS — K219 Gastro-esophageal reflux disease without esophagitis: Secondary | ICD-10-CM | POA: Diagnosis not present

## 2011-11-27 DIAGNOSIS — H409 Unspecified glaucoma: Secondary | ICD-10-CM | POA: Diagnosis not present

## 2011-11-27 DIAGNOSIS — H18519 Endothelial corneal dystrophy, unspecified eye: Secondary | ICD-10-CM | POA: Diagnosis not present

## 2011-11-27 DIAGNOSIS — H401290 Low-tension glaucoma, unspecified eye, stage unspecified: Secondary | ICD-10-CM | POA: Diagnosis not present

## 2011-12-10 DIAGNOSIS — Z23 Encounter for immunization: Secondary | ICD-10-CM | POA: Diagnosis not present

## 2011-12-16 DIAGNOSIS — M999 Biomechanical lesion, unspecified: Secondary | ICD-10-CM | POA: Diagnosis not present

## 2011-12-16 DIAGNOSIS — M5126 Other intervertebral disc displacement, lumbar region: Secondary | ICD-10-CM | POA: Diagnosis not present

## 2011-12-23 DIAGNOSIS — R059 Cough, unspecified: Secondary | ICD-10-CM | POA: Diagnosis not present

## 2011-12-23 DIAGNOSIS — I712 Thoracic aortic aneurysm, without rupture: Secondary | ICD-10-CM | POA: Diagnosis not present

## 2011-12-23 DIAGNOSIS — M4000 Postural kyphosis, site unspecified: Secondary | ICD-10-CM | POA: Diagnosis not present

## 2011-12-23 DIAGNOSIS — IMO0002 Reserved for concepts with insufficient information to code with codable children: Secondary | ICD-10-CM | POA: Diagnosis not present

## 2011-12-23 DIAGNOSIS — R05 Cough: Secondary | ICD-10-CM | POA: Diagnosis not present

## 2011-12-23 DIAGNOSIS — R918 Other nonspecific abnormal finding of lung field: Secondary | ICD-10-CM | POA: Diagnosis not present

## 2012-01-15 DIAGNOSIS — M999 Biomechanical lesion, unspecified: Secondary | ICD-10-CM | POA: Diagnosis not present

## 2012-01-15 DIAGNOSIS — M5126 Other intervertebral disc displacement, lumbar region: Secondary | ICD-10-CM | POA: Diagnosis not present

## 2012-01-29 ENCOUNTER — Institutional Professional Consult (permissible substitution) (INDEPENDENT_AMBULATORY_CARE_PROVIDER_SITE_OTHER): Payer: Medicare Other | Admitting: Cardiothoracic Surgery

## 2012-01-29 ENCOUNTER — Encounter: Payer: Self-pay | Admitting: Cardiothoracic Surgery

## 2012-01-29 ENCOUNTER — Other Ambulatory Visit: Payer: Self-pay | Admitting: *Deleted

## 2012-01-29 VITALS — BP 121/66 | HR 80 | Resp 18 | Ht 66.0 in | Wt 115.0 lb

## 2012-01-29 DIAGNOSIS — I712 Thoracic aortic aneurysm, without rupture, unspecified: Secondary | ICD-10-CM

## 2012-01-29 DIAGNOSIS — I350 Nonrheumatic aortic (valve) stenosis: Secondary | ICD-10-CM

## 2012-01-29 DIAGNOSIS — I059 Rheumatic mitral valve disease, unspecified: Secondary | ICD-10-CM

## 2012-01-29 NOTE — Progress Notes (Signed)
[address]            [place] [address]          [phone]      Kelsey Griffin [facility] Medical Record #[id number] Date of Birth: 03-13-1931  Referring: Philemon Kingdom, MD Primary Care: Philemon Kingdom, MD  Chief Complaint:    Chief Complaint  Patient presents with  . Thoracic Aortic Aneurysm    Referral from Dr Christinia Gully for surgical eval on ascending aortic aneurysm, CTA Chest 12/23/11    History of Present Illness:    Patient is a elderly-appearing 78 year old female requires rolling walker to get around presents to the office with CT scan evidence of a dilated ascending aortic aneurysm. She started having coughing spells approximately 6 months ago which led to a chest x-ray and subsequent CT scan of the chest. The descending thoracic aorta measured approximately 4.3-4.5 cm this is slightly enlarged from 4.0 cm on a CT scan done in 2005. The patient has underlying chronic obstructive pulmonary disease.  Her chart notes mitral valve prolapse and aortic stenosis mild but there is no objective documentation of this. The patient notes that she's had a echocardiogram one or more years ago but none recently.  She denies chest pain denies angina denies syncope denies PND. She is significantly limited by fibromyalgia and severe osteoporosis And old lumbar spine fractures. She has a history of an old stroke documented on CT scan.   Current Activity/ Functional Status: Patient is not independent with mobility/ambulation, transfers, ADL's, IADL's.   Past Medical History  Diagnosis Date  . Fibromyalgia   . Osteoporosis   . Mitral valve prolapse   . Aortic stenosis     mild  . Bladder infection, chronic   . Diabetes mellitus, type 2   . Depression   . GERD (gastroesophageal reflux disease)   . Insomnia   . Anxiety   . Stroke   . Arthritis   . Seasonal allergies     Past Surgical History  Procedure Date  . Ovarian cyst resection on  left 1958  . Bladder tack 1959, [id number]  . Breast lump removed 1962, 1983  . Appendectomy   . Eye surgery   . Hernia repair   . Tubal ligation     Family History  Problem Relation Age of Onset  . Diabetes Maternal Grandmother   . Heart disease Maternal Grandmother   . Heart attack Maternal Grandmother   . Breast cancer Sister   . Diabetes Sister   . Ovarian cancer Sister   . GER disease Sister   . Anxiety disorder Sister   . Diabetes Daughter   . Anxiety disorder Daughter   . Kidney disease Mother     History   Social History  . Marital Status: Married    Spouse Name: N/A    Number of Children: 3  . Years of Education: N/A   Occupational History  . retired Worked in Press photographer for many years   Social History Main Topics  . Smoking status: Never Smoker   . Smokeless tobacco: Never Used  . Alcohol Use: Not on file  . Drug Use: Not on file  . Sexually Active: Not on file   Other Topics Concern  . Not on file   Social History Narrative   Lives with Husband    History  Smoking status  . Never Smoker  Smokeless tobacco  . Never Used    History  Alcohol Use: Not on file     Allergies  Allergen Reactions  . Bisphosphonates     REACTION: states intol to all bisphos \\T \ Evista too...  . Codeine     REACTION: nausea  . Escitalopram Oxalate     REACTION: nervousness  . Milnacipran     REACTION: blurred vision, cough  . Penicillins     REACTION: rash  . Venlafaxine     REACTION: vomiting and flusing    Current Outpatient Prescriptions  Medication Sig Dispense Refill  . ALPRAZolam (XANAX) 0.5 MG tablet Take 0.5 mg by mouth at bedtime as needed.      Marland Kitchen amitriptyline (ELAVIL) 25 MG tablet Take 25 mg by mouth at bedtime.      Marland Kitchen aspirin 81 MG tablet Take 81 mg by mouth daily.      . Calcium Carb-Cholecalciferol (CALCIUM 500 +D) 500-400 MG-UNIT TABS Take by mouth 2 (two) times daily.      Marland Kitchen co-enzyme Q-10 30 MG capsule Take 30 mg by mouth 3 (three) times  daily.      . cyanocobalamin 1000 MCG tablet Take 100 mcg by mouth daily.      . fish oil-omega-3 fatty acids 1000 MG capsule Take 2 g by mouth daily.      . Magnesium 100 MG TABS Take by mouth daily.      . Manganese 50 MG TABS Take by mouth daily.      . Multiple Vitamins-Minerals (MULTIVITAMIN PO) Take by mouth daily.      . naproxen (NAPROSYN) 500 MG tablet Take 500 mg by mouth 2 (two) times daily with a meal.      . ranitidine (ZANTAC) 150 MG tablet Take 150 mg by mouth 2 (two) times daily.      . vitamin E 400 UNIT capsule Take 400 Units by mouth daily.           Review of Systems:     Cardiac Review of Systems: Y or N  Chest Pain [ n   ]  Resting SOB [ y  ] Exertional SOB  [ y ]  Orthopnea [ n ]   Pedal Edema [ n  ]    Palpitations [ n ] Syncope  [n  ]   Presyncope [n  ]  General Review of Systems: [Y] = yes [  ]=no Constitional: recent weight change [  ]; anorexia [  ]; fatigue [  ]; nausea [  ]; night sweats [  ]; fever [  ]; or chills [  ];                                                                                                                                          Dental: poor dentition[ n ]; Last Dentist visit:   Eye : blurred  vision [  ]; diplopia [n   ]; vision changes [  ];  Amaurosis fugax[n  ]; Resp: cough [  ];  wheezing[  ];  hemoptysis[  ]; shortness of breath[  ]; paroxysmal nocturnal dyspnea[  ]; dyspnea on exertion[  ]; or orthopnea[  ];  GI:  gallstones[  ], vomiting[  ];  dysphagia[  ]; melena[  ];  hematochezia [  ]; heartburn[  ];   Hx of  Colonoscopy[  ]; GU: kidney stones [ n ]; hematuria[n  ];   dysuria [n  ];  nocturia[  ];  history of     obstruction [  ];             Skin: rash, swelling[  ];, hair loss[  ];  peripheral edema[  ];  or itching[  ]; Musculosketetal: myalgias[y  ];  joint swelling[ y ];  joint erythema[y  ];  joint pain[ y ];  back pain[  y];  Heme/Lymph: bruising[  ];  bleeding[  ];  anemia[  ];  Neuro: TIA[ n ];  headaches[  ];   stroke[y  ];  vertigo[ n ];  seizures[n  ];   paresthesias[n  ];  difficulty walking[ y ];  Psych:depression[y  ]; anxiety[  ];  Endocrine: diabetes[  ];  thyroid dysfunction[  ];  Immunizations: Flu [ ? ]; Pneumococcal[?  ];  Other:  Physical Exam: BP 121/66  Pulse 80  Resp 18  Ht 5\' 6"  (1.676 m)  Wt 115 lb (52.164 kg)  BMI 18.56 kg/m2  SpO2 99%  General appearance: alert, cooperative, appears older than stated age, cachectic and no distress Neurologic: intact Heart: regularly irregular rhythm and systolic murmur: early systolic 2/6, crescendo at lower left sternal border Lungs: clear to auscultation bilaterally and normal percussion bilaterally Abdomen: soft, non-tender; bowel sounds normal; no masses,  no organomegaly Extremities: extremities normal, atraumatic, no cyanosis or edema and Homans sign is negative, no sign of DVT Patient has no carotid bruits,palpable DP and PT pulses   Diagnostic Studies & Laboratory data:     Recent Radiology Findings:  CT scan 2005:  Clinical data: Generalized abdominal and pelvic pain; wt loss; nausea and vomiting; abnormal lt apical pulmonary opacity seen on recent chest radiograph TECHNIQUE: Multidetector CT imaging of the chest, abdomen, and pelvis was performed during administration of 150 cc Omnipaque 300 intravenous contrast. Oral contrast was also administered.  CHEST CT WITH CONTRAST Comparison is made with recent chest radiograph on 10/07/03 from [facility].  No suspicious pulmonary nodules or masses are seen. No abnormal pulmonary opacity is seen in the left lung apex in the area questioned on the previous chest radiograph. Mild scarring is seen involving the right middle lobe.  There is no evidence of pleural or pericardial effusion. There is no evidence of hilar or mediastinal masses or adenopathy. Mild cardiomegaly is noted and there is an aneurysm of the ascending thoracic aorta measuring approximately 4.1 cm in greatest diameter.  There is no evidence of aortic dissection.  IMPRESSION 1. No evidence of pulmonary masses or other active cardiopulmonary disease.  2. 4.1 cm ascending thoracic aortic aneurysm. ABDOMEN CT WITH CONTRAST A small, less than 1.0 cm low density lesion is seen in the central left hepatic lobe which is too small to characterize by CT and most likely represents a tiny cyst. The liver is otherwise unremarkable in appearance. The gallbladder, pancreas, spleen, adrenal glands, and kidneys are also normal in appearance.  There  is no evidence of mass or adenopathy. There is no evidence of inflammatory process or abnormal fluid collections.  IMPRESSION Unremarkable abdomen CT. Probable small left hepatic lobe cyst incidentally noted.  PELVIS CT WITH CONTRAST There is no evidence of pelvic mass or adenopathy. There is no evidence of inflammatory process or abnormal fluid collections. There is no evidence of dilated bowel loops or hernia. IMPRESSION Negative pelvis CT.  CT scan 2013: Clinical Data: Prominent aortic arch on recent chest x-ray  CT ANGIOGRAPHY CHEST  Technique: Multidetector CT imaging of the chest using the standard protocol during bolus administration of intravenous contrast. Multiplanar reconstructed images including MIPs were obtained and reviewed to evaluate the vascular anatomy.  Contrast: 80 ml of Isovue 370  Comparison: 11/25/2011  Findings: The lungs are well-aerated bilaterally without evidence of focal infiltrate or sizable parenchymal nodule.  The thoracic aorta demonstrates diffuse calcifications and measures 4.5 x 98-4.2 cm in greatest AP and transverse dimensions in the ascending component. It is tortuous in the region of the aortic arch. The vessels of the neck and upper extremities are widely patent proximally. The aorta tapers in a normal fashion distally. No pulmonary emboli are identified.  The visualized upper abdomen is within normal limits.  Degenerative changes of the thoracic spine are noted with increased kyphosis. No acute abnormality is noted.  IMPRESSION: Aneurysmal dilatation of the ascending aorta as described above. No dissection is identified.  No other focal abnormality is noted.   Original Report Authenticated By: Phillips Odor, M.D.    Recent Lab Findings: Lab Results  Component Value Date   WBC 6.5 05/03/2009   HGB 12.6 05/03/2009   HCT 38.0 05/03/2009   PLT 247.0 05/03/2009   GLUCOSE 100* 05/03/2009   CHOL 180 05/03/2009   TRIG 65.0 05/03/2009   HDL 60.60 05/03/2009   LDLDIRECT 141.9 08/10/2008   LDLCALC 106* 05/03/2009   ALT 27 05/03/2009   AST 25 05/03/2009   NA 142 05/03/2009   K 3.9 05/03/2009   CL 106 05/03/2009   CREATININE 0.6 05/03/2009   BUN 11 05/03/2009   CO2 35* 05/03/2009   TSH 1.05 05/03/2009      Assessment / Plan:      Patient is a frail-appearing 78 year old female who has documented descending aorta 4.4 - 4.5 cm on a recent CT scan of the chest. CT scan of the chest in 2005 showed a 4.0 cm ascending aorta. She has an early systolic ejection murmur and question of history of aortic stenosis though this has not been documented. I discussed with the patient and her daughter in detail the diagnosis of dilated descending aorta with less than half centimeter change over a 9 year. The patient is somewhat frail I would not recommend elective repair of the ascending aorta at this time as over the years there's been little change and the sizes not reached 5.5 cm. To evaluate the degree of potential aortic stenosis we will obtain an echocardiogram in the next several weeks. We'll plan to see her back in one year with a followup CT scan of the chest.     Delight Ovens MD  Beeper [phone] Office [phone] 01/29/2012 1:35 PM

## 2012-01-29 NOTE — Patient Instructions (Addendum)
Thoracic Aortic Aneurysm An aneurysm is the enlargement (dilatation), bulging or ballooning out of part of the wall of a vein or artery. An Aortic Aneurysm is a bulging in the largest artery of the body. This artery supplies blood from the heart to the rest of the body. The first part of the aorta is called the thoracic aorta. It leaves the heart, ascends (rises), arches, and descends (goes down) through the chest until it reaches the diaphragm (the muscular partition between the chest and abdomen (belly). The second part of the aorta is then called the abdominal aorta after it has passed the diaphragm and continues down through the abdomen. The abdominal aorta ends where it splits to form the two iliac arteries that go to the legs. Aortic aneurysms can develop anywhere along the length of the aorta. A thoracic aortic aneurysm (TAA) occurs in the first part of the aorta, between the heart and the diaphragm. The major importance of an aneurysm is that it can rupture or tear (dissect), causing death unless diagnosed and treated promptly. CAUSES  Most thoracic aortic aneurysms are related to arteriosclerosis. The arteriosclerosis can weaken the aortic wall. The pressure of the blood being pumped through the aorta causes it to balloon out at the site of weakness. Therefore, elevated blood pressure (hypertension) is associated with aneurysm. Other risk factors include:  Age over 60.  Tobacco use.  Female sex.  Family history of aneurysm. Additional causes of thoracic aortic aneurysms include:  Genetics (passed by birth).  Injury: After physical trauma to the aorta.  Inflammation of blood vessels.  Hardening of the arteries.  Infection. SYMPTOMS  Many aneurysms do not cause problems. A small, unchanging or slowly changing aneurysm may produce no symptoms until it suddenly ruptures or dissects (separation of the layers of the aortic wall) without warning. It may then cause death. The symptoms  (problems) of a developing aneurysm will partly depend on its size and rate of growth. Thoracic aortic aneurysms may cause pain in the:  Chest.  Back.  Sides.  Abdomen. The pain most often has a deep quality as if it is boring into the person. It may cause:  Heart failure.  Heart attack.  Hoarseness.  Cough.  Shortness of breath.  Swallowing problems. DIAGNOSIS  A thoracic aortic aneurysm may be suspected based on your symptoms. It may also be detected by x-ray or CT studies done for unrelated reasons.  Several different imaging studies can be used to confirm a TAA:  An echocardiogram is an ultrasound test to examine the heart. It can also examine the first parts of the aorta. Sometimes, this test is done by putting you to sleep and inserting a flexible telescope through your mouth into your esophagus, which is next to the aorta; excellent pictures of the aorta can be obtained. This is called a transesophageal echocardiogram (TEE).  CT scanning of the chest is accurate at showing the exact size and shape of the aneurysm.  MRI scanning is accurate, and is used for certain types of TAA.  An aortic angiogram shows the source of the major blood vessels arising from the aorta. It reveals the size and extent of any aneurysm. It can also show a clot clinging to the wall of the aneurysm (mural thrombus). The angiogram may give information about a tear of the aorta. TREATMENT  Treatment for a thoracic aortic aneurysm depends on:  Location.  Size.  Other factors.  Rate of growth.  Underlying cause. Medical treatment is used   for smaller or complicated aneurysms, or those that do not cause symptoms. These include:  Stopping smoking.  Blood pressure control.  Control of cholesterol. Surgical treatment is used for aneurysms that cause symptoms, or for those that are large or growing in size. The surgical technique depends on the location of the aneurysm. HOME CARE INSTRUCTIONS     If you smoke, stop. If you do not, do not start.  Take all medications as prescribed.  Your caregiver will tell you when to have your aneurysm rechecked, either by echocardiogram or CT scan. Be sure to keep this and all follow-up appointments. SEEK MEDICAL CARE IF:   You develop mild pain in your chest, upper back, sides, or abdomen.  You develop cough, hoarseness or trouble swallowing. SEEK IMMEDIATE MEDICAL CARE IF:   You develop severe chest or abdominal pain, or severe pain moving (radiating) to your back.  You suddenly develop cold or blue toes or feet.  You suddenly develop lightheadedness or fainting spells.  You develop trouble breathing. Document Released: 02/10/2005 Document Revised: 05/05/2011 Document Reviewed: 12/30/2006 [employer] Patient Information 2013 [employer].  

## 2012-02-11 ENCOUNTER — Ambulatory Visit (HOSPITAL_COMMUNITY)
Admission: RE | Admit: 2012-02-11 | Discharge: 2012-02-11 | Disposition: A | Discharge status: Home / Self Care | Payer: Medicare Other | Source: Ambulatory Visit | Attending: Cardiothoracic Surgery | Admitting: Cardiothoracic Surgery

## 2012-02-11 DIAGNOSIS — R609 Edema, unspecified: Secondary | ICD-10-CM | POA: Diagnosis not present

## 2012-02-11 DIAGNOSIS — I059 Rheumatic mitral valve disease, unspecified: Secondary | ICD-10-CM | POA: Insufficient documentation

## 2012-02-11 DIAGNOSIS — J438 Other emphysema: Secondary | ICD-10-CM | POA: Diagnosis not present

## 2012-02-11 DIAGNOSIS — I359 Nonrheumatic aortic valve disorder, unspecified: Secondary | ICD-10-CM | POA: Diagnosis not present

## 2012-02-11 DIAGNOSIS — E785 Hyperlipidemia, unspecified: Secondary | ICD-10-CM | POA: Diagnosis not present

## 2012-02-11 DIAGNOSIS — Z87898 Personal history of other specified conditions: Secondary | ICD-10-CM | POA: Diagnosis not present

## 2012-02-11 DIAGNOSIS — I350 Nonrheumatic aortic (valve) stenosis: Secondary | ICD-10-CM

## 2012-02-11 NOTE — Progress Notes (Signed)
  Echocardiogram 2D Echocardiogram has been performed.  Kelsey Griffin 02/11/2012, 11:18 AM

## 2012-02-12 DIAGNOSIS — M999 Biomechanical lesion, unspecified: Secondary | ICD-10-CM | POA: Diagnosis not present

## 2012-02-12 DIAGNOSIS — M5126 Other intervertebral disc displacement, lumbar region: Secondary | ICD-10-CM | POA: Diagnosis not present

## 2012-03-11 DIAGNOSIS — M5126 Other intervertebral disc displacement, lumbar region: Secondary | ICD-10-CM | POA: Diagnosis not present

## 2012-03-11 DIAGNOSIS — M999 Biomechanical lesion, unspecified: Secondary | ICD-10-CM | POA: Diagnosis not present

## 2012-03-25 DIAGNOSIS — M5126 Other intervertebral disc displacement, lumbar region: Secondary | ICD-10-CM | POA: Diagnosis not present

## 2012-03-25 DIAGNOSIS — M999 Biomechanical lesion, unspecified: Secondary | ICD-10-CM | POA: Diagnosis not present

## 2012-04-06 DIAGNOSIS — M810 Age-related osteoporosis without current pathological fracture: Secondary | ICD-10-CM | POA: Diagnosis not present

## 2012-04-06 DIAGNOSIS — R5381 Other malaise: Secondary | ICD-10-CM | POA: Diagnosis not present

## 2012-04-06 DIAGNOSIS — N814 Uterovaginal prolapse, unspecified: Secondary | ICD-10-CM | POA: Diagnosis not present

## 2012-04-06 DIAGNOSIS — R300 Dysuria: Secondary | ICD-10-CM | POA: Diagnosis not present

## 2012-04-06 DIAGNOSIS — E119 Type 2 diabetes mellitus without complications: Secondary | ICD-10-CM | POA: Diagnosis not present

## 2012-04-06 DIAGNOSIS — K5900 Constipation, unspecified: Secondary | ICD-10-CM | POA: Diagnosis not present

## 2012-04-06 DIAGNOSIS — R002 Palpitations: Secondary | ICD-10-CM | POA: Diagnosis not present

## 2012-04-06 DIAGNOSIS — R209 Unspecified disturbances of skin sensation: Secondary | ICD-10-CM | POA: Diagnosis not present

## 2012-04-06 DIAGNOSIS — R5383 Other fatigue: Secondary | ICD-10-CM | POA: Diagnosis not present

## 2012-04-14 DIAGNOSIS — B370 Candidal stomatitis: Secondary | ICD-10-CM | POA: Diagnosis not present

## 2012-04-14 DIAGNOSIS — J019 Acute sinusitis, unspecified: Secondary | ICD-10-CM | POA: Diagnosis not present

## 2012-04-14 DIAGNOSIS — J209 Acute bronchitis, unspecified: Secondary | ICD-10-CM | POA: Diagnosis not present

## 2012-05-11 DIAGNOSIS — M5137 Other intervertebral disc degeneration, lumbosacral region: Secondary | ICD-10-CM | POA: Diagnosis not present

## 2012-05-11 DIAGNOSIS — M4120 Other idiopathic scoliosis, site unspecified: Secondary | ICD-10-CM | POA: Diagnosis not present

## 2012-05-11 DIAGNOSIS — M47817 Spondylosis without myelopathy or radiculopathy, lumbosacral region: Secondary | ICD-10-CM | POA: Diagnosis not present

## 2012-05-20 DIAGNOSIS — N814 Uterovaginal prolapse, unspecified: Secondary | ICD-10-CM | POA: Diagnosis not present

## 2012-05-25 DIAGNOSIS — R609 Edema, unspecified: Secondary | ICD-10-CM | POA: Diagnosis not present

## 2012-05-25 DIAGNOSIS — D539 Nutritional anemia, unspecified: Secondary | ICD-10-CM | POA: Diagnosis not present

## 2012-06-03 DIAGNOSIS — M5126 Other intervertebral disc displacement, lumbar region: Secondary | ICD-10-CM | POA: Diagnosis not present

## 2012-06-03 DIAGNOSIS — M999 Biomechanical lesion, unspecified: Secondary | ICD-10-CM | POA: Diagnosis not present

## 2012-07-01 DIAGNOSIS — D649 Anemia, unspecified: Secondary | ICD-10-CM | POA: Diagnosis not present

## 2012-09-02 DIAGNOSIS — M5126 Other intervertebral disc displacement, lumbar region: Secondary | ICD-10-CM | POA: Diagnosis not present

## 2012-09-02 DIAGNOSIS — M999 Biomechanical lesion, unspecified: Secondary | ICD-10-CM | POA: Diagnosis not present

## 2012-09-07 DIAGNOSIS — D649 Anemia, unspecified: Secondary | ICD-10-CM | POA: Diagnosis not present

## 2012-09-21 DIAGNOSIS — M5126 Other intervertebral disc displacement, lumbar region: Secondary | ICD-10-CM | POA: Diagnosis not present

## 2012-09-21 DIAGNOSIS — M999 Biomechanical lesion, unspecified: Secondary | ICD-10-CM | POA: Diagnosis not present

## 2012-09-28 DIAGNOSIS — M999 Biomechanical lesion, unspecified: Secondary | ICD-10-CM | POA: Diagnosis not present

## 2012-09-28 DIAGNOSIS — M5126 Other intervertebral disc displacement, lumbar region: Secondary | ICD-10-CM | POA: Diagnosis not present

## 2012-09-29 DIAGNOSIS — E538 Deficiency of other specified B group vitamins: Secondary | ICD-10-CM | POA: Diagnosis not present

## 2012-09-29 DIAGNOSIS — N390 Urinary tract infection, site not specified: Secondary | ICD-10-CM | POA: Diagnosis not present

## 2012-09-29 DIAGNOSIS — R7989 Other specified abnormal findings of blood chemistry: Secondary | ICD-10-CM | POA: Diagnosis not present

## 2012-09-29 DIAGNOSIS — D649 Anemia, unspecified: Secondary | ICD-10-CM | POA: Diagnosis not present

## 2012-09-29 DIAGNOSIS — R609 Edema, unspecified: Secondary | ICD-10-CM | POA: Diagnosis not present

## 2012-10-01 DIAGNOSIS — R5381 Other malaise: Secondary | ICD-10-CM | POA: Diagnosis not present

## 2012-10-01 DIAGNOSIS — IMO0001 Reserved for inherently not codable concepts without codable children: Secondary | ICD-10-CM | POA: Diagnosis not present

## 2012-10-01 DIAGNOSIS — G929 Unspecified toxic encephalopathy: Secondary | ICD-10-CM | POA: Diagnosis not present

## 2012-10-01 DIAGNOSIS — Z79899 Other long term (current) drug therapy: Secondary | ICD-10-CM | POA: Diagnosis not present

## 2012-10-01 DIAGNOSIS — G40909 Epilepsy, unspecified, not intractable, without status epilepticus: Secondary | ICD-10-CM | POA: Diagnosis not present

## 2012-10-01 DIAGNOSIS — H04129 Dry eye syndrome of unspecified lacrimal gland: Secondary | ICD-10-CM | POA: Diagnosis not present

## 2012-10-01 DIAGNOSIS — H26499 Other secondary cataract, unspecified eye: Secondary | ICD-10-CM | POA: Diagnosis not present

## 2012-10-01 DIAGNOSIS — Z961 Presence of intraocular lens: Secondary | ICD-10-CM | POA: Diagnosis not present

## 2012-10-01 DIAGNOSIS — H401290 Low-tension glaucoma, unspecified eye, stage unspecified: Secondary | ICD-10-CM | POA: Diagnosis not present

## 2012-10-01 DIAGNOSIS — Z7982 Long term (current) use of aspirin: Secondary | ICD-10-CM | POA: Diagnosis not present

## 2012-10-01 DIAGNOSIS — F29 Unspecified psychosis not due to a substance or known physiological condition: Secondary | ICD-10-CM | POA: Diagnosis not present

## 2012-10-01 DIAGNOSIS — K219 Gastro-esophageal reflux disease without esophagitis: Secondary | ICD-10-CM | POA: Diagnosis not present

## 2012-10-01 DIAGNOSIS — R5383 Other fatigue: Secondary | ICD-10-CM | POA: Diagnosis not present

## 2012-10-01 DIAGNOSIS — N390 Urinary tract infection, site not specified: Secondary | ICD-10-CM | POA: Diagnosis not present

## 2012-10-01 DIAGNOSIS — R404 Transient alteration of awareness: Secondary | ICD-10-CM | POA: Diagnosis not present

## 2012-10-01 DIAGNOSIS — G92 Toxic encephalopathy: Secondary | ICD-10-CM | POA: Diagnosis not present

## 2012-10-01 DIAGNOSIS — M818 Other osteoporosis without current pathological fracture: Secondary | ICD-10-CM | POA: Diagnosis not present

## 2012-10-01 DIAGNOSIS — H409 Unspecified glaucoma: Secondary | ICD-10-CM | POA: Diagnosis not present

## 2012-10-01 DIAGNOSIS — H18519 Endothelial corneal dystrophy, unspecified eye: Secondary | ICD-10-CM | POA: Diagnosis not present

## 2012-10-01 DIAGNOSIS — H43399 Other vitreous opacities, unspecified eye: Secondary | ICD-10-CM | POA: Diagnosis not present

## 2012-10-01 DIAGNOSIS — F411 Generalized anxiety disorder: Secondary | ICD-10-CM | POA: Diagnosis not present

## 2012-10-02 DIAGNOSIS — M818 Other osteoporosis without current pathological fracture: Secondary | ICD-10-CM | POA: Diagnosis not present

## 2012-10-02 DIAGNOSIS — N390 Urinary tract infection, site not specified: Secondary | ICD-10-CM | POA: Diagnosis not present

## 2012-10-02 DIAGNOSIS — F411 Generalized anxiety disorder: Secondary | ICD-10-CM | POA: Diagnosis not present

## 2012-10-02 DIAGNOSIS — R5381 Other malaise: Secondary | ICD-10-CM | POA: Diagnosis not present

## 2012-10-02 DIAGNOSIS — G92 Toxic encephalopathy: Secondary | ICD-10-CM | POA: Diagnosis present

## 2012-10-02 DIAGNOSIS — IMO0001 Reserved for inherently not codable concepts without codable children: Secondary | ICD-10-CM | POA: Diagnosis not present

## 2012-10-02 DIAGNOSIS — R030 Elevated blood-pressure reading, without diagnosis of hypertension: Secondary | ICD-10-CM | POA: Diagnosis not present

## 2012-10-02 DIAGNOSIS — R262 Difficulty in walking, not elsewhere classified: Secondary | ICD-10-CM | POA: Diagnosis not present

## 2012-10-02 DIAGNOSIS — M810 Age-related osteoporosis without current pathological fracture: Secondary | ICD-10-CM | POA: Diagnosis not present

## 2012-10-02 DIAGNOSIS — G929 Unspecified toxic encephalopathy: Secondary | ICD-10-CM | POA: Diagnosis not present

## 2012-10-02 DIAGNOSIS — Z79899 Other long term (current) drug therapy: Secondary | ICD-10-CM | POA: Diagnosis not present

## 2012-10-02 DIAGNOSIS — Z7982 Long term (current) use of aspirin: Secondary | ICD-10-CM | POA: Diagnosis not present

## 2012-10-02 DIAGNOSIS — F29 Unspecified psychosis not due to a substance or known physiological condition: Secondary | ICD-10-CM | POA: Diagnosis not present

## 2012-10-02 DIAGNOSIS — R5383 Other fatigue: Secondary | ICD-10-CM | POA: Diagnosis not present

## 2012-10-02 DIAGNOSIS — R404 Transient alteration of awareness: Secondary | ICD-10-CM | POA: Diagnosis not present

## 2012-10-02 DIAGNOSIS — K219 Gastro-esophageal reflux disease without esophagitis: Secondary | ICD-10-CM | POA: Diagnosis not present

## 2012-10-05 DIAGNOSIS — N390 Urinary tract infection, site not specified: Secondary | ICD-10-CM | POA: Diagnosis not present

## 2012-10-05 DIAGNOSIS — R5381 Other malaise: Secondary | ICD-10-CM | POA: Diagnosis not present

## 2012-10-05 DIAGNOSIS — R5383 Other fatigue: Secondary | ICD-10-CM | POA: Diagnosis not present

## 2012-10-05 DIAGNOSIS — D510 Vitamin B12 deficiency anemia due to intrinsic factor deficiency: Secondary | ICD-10-CM | POA: Diagnosis not present

## 2012-10-05 DIAGNOSIS — M4000 Postural kyphosis, site unspecified: Secondary | ICD-10-CM | POA: Diagnosis not present

## 2012-10-06 DIAGNOSIS — R4182 Altered mental status, unspecified: Secondary | ICD-10-CM | POA: Diagnosis not present

## 2012-10-06 DIAGNOSIS — F0390 Unspecified dementia without behavioral disturbance: Secondary | ICD-10-CM | POA: Diagnosis not present

## 2012-10-11 DIAGNOSIS — M4000 Postural kyphosis, site unspecified: Secondary | ICD-10-CM | POA: Diagnosis not present

## 2012-10-11 DIAGNOSIS — D510 Vitamin B12 deficiency anemia due to intrinsic factor deficiency: Secondary | ICD-10-CM | POA: Diagnosis not present

## 2012-10-11 DIAGNOSIS — R5381 Other malaise: Secondary | ICD-10-CM | POA: Diagnosis not present

## 2012-10-11 DIAGNOSIS — N390 Urinary tract infection, site not specified: Secondary | ICD-10-CM | POA: Diagnosis not present

## 2012-10-12 DIAGNOSIS — N390 Urinary tract infection, site not specified: Secondary | ICD-10-CM | POA: Diagnosis not present

## 2012-10-12 DIAGNOSIS — D510 Vitamin B12 deficiency anemia due to intrinsic factor deficiency: Secondary | ICD-10-CM | POA: Diagnosis not present

## 2012-10-12 DIAGNOSIS — M4000 Postural kyphosis, site unspecified: Secondary | ICD-10-CM | POA: Diagnosis not present

## 2012-10-12 DIAGNOSIS — R5381 Other malaise: Secondary | ICD-10-CM | POA: Diagnosis not present

## 2012-10-13 DIAGNOSIS — N390 Urinary tract infection, site not specified: Secondary | ICD-10-CM | POA: Diagnosis not present

## 2012-10-13 DIAGNOSIS — R5381 Other malaise: Secondary | ICD-10-CM | POA: Diagnosis not present

## 2012-10-13 DIAGNOSIS — D510 Vitamin B12 deficiency anemia due to intrinsic factor deficiency: Secondary | ICD-10-CM | POA: Diagnosis not present

## 2012-10-13 DIAGNOSIS — M4000 Postural kyphosis, site unspecified: Secondary | ICD-10-CM | POA: Diagnosis not present

## 2012-10-14 DIAGNOSIS — N390 Urinary tract infection, site not specified: Secondary | ICD-10-CM | POA: Diagnosis not present

## 2012-10-14 DIAGNOSIS — R5381 Other malaise: Secondary | ICD-10-CM | POA: Diagnosis not present

## 2012-10-14 DIAGNOSIS — D510 Vitamin B12 deficiency anemia due to intrinsic factor deficiency: Secondary | ICD-10-CM | POA: Diagnosis not present

## 2012-10-14 DIAGNOSIS — M4000 Postural kyphosis, site unspecified: Secondary | ICD-10-CM | POA: Diagnosis not present

## 2012-10-18 DIAGNOSIS — F411 Generalized anxiety disorder: Secondary | ICD-10-CM | POA: Diagnosis not present

## 2012-10-18 DIAGNOSIS — R079 Chest pain, unspecified: Secondary | ICD-10-CM | POA: Diagnosis not present

## 2012-10-18 DIAGNOSIS — F319 Bipolar disorder, unspecified: Secondary | ICD-10-CM | POA: Diagnosis not present

## 2012-10-19 DIAGNOSIS — N390 Urinary tract infection, site not specified: Secondary | ICD-10-CM | POA: Diagnosis not present

## 2012-10-19 DIAGNOSIS — M5126 Other intervertebral disc displacement, lumbar region: Secondary | ICD-10-CM | POA: Diagnosis not present

## 2012-10-19 DIAGNOSIS — M4000 Postural kyphosis, site unspecified: Secondary | ICD-10-CM | POA: Diagnosis not present

## 2012-10-19 DIAGNOSIS — D510 Vitamin B12 deficiency anemia due to intrinsic factor deficiency: Secondary | ICD-10-CM | POA: Diagnosis not present

## 2012-10-19 DIAGNOSIS — R5381 Other malaise: Secondary | ICD-10-CM | POA: Diagnosis not present

## 2012-10-19 DIAGNOSIS — M999 Biomechanical lesion, unspecified: Secondary | ICD-10-CM | POA: Diagnosis not present

## 2012-10-20 DIAGNOSIS — R5381 Other malaise: Secondary | ICD-10-CM | POA: Diagnosis not present

## 2012-10-20 DIAGNOSIS — N390 Urinary tract infection, site not specified: Secondary | ICD-10-CM | POA: Diagnosis not present

## 2012-10-20 DIAGNOSIS — D510 Vitamin B12 deficiency anemia due to intrinsic factor deficiency: Secondary | ICD-10-CM | POA: Diagnosis not present

## 2012-10-20 DIAGNOSIS — M4000 Postural kyphosis, site unspecified: Secondary | ICD-10-CM | POA: Diagnosis not present

## 2012-10-22 DIAGNOSIS — N390 Urinary tract infection, site not specified: Secondary | ICD-10-CM | POA: Diagnosis not present

## 2012-10-22 DIAGNOSIS — M4000 Postural kyphosis, site unspecified: Secondary | ICD-10-CM | POA: Diagnosis not present

## 2012-10-22 DIAGNOSIS — D510 Vitamin B12 deficiency anemia due to intrinsic factor deficiency: Secondary | ICD-10-CM | POA: Diagnosis not present

## 2012-10-22 DIAGNOSIS — R5381 Other malaise: Secondary | ICD-10-CM | POA: Diagnosis not present

## 2012-10-26 DIAGNOSIS — M4000 Postural kyphosis, site unspecified: Secondary | ICD-10-CM | POA: Diagnosis not present

## 2012-10-26 DIAGNOSIS — N390 Urinary tract infection, site not specified: Secondary | ICD-10-CM | POA: Diagnosis not present

## 2012-10-26 DIAGNOSIS — D510 Vitamin B12 deficiency anemia due to intrinsic factor deficiency: Secondary | ICD-10-CM | POA: Diagnosis not present

## 2012-10-26 DIAGNOSIS — R5381 Other malaise: Secondary | ICD-10-CM | POA: Diagnosis not present

## 2012-10-27 DIAGNOSIS — R5381 Other malaise: Secondary | ICD-10-CM | POA: Diagnosis not present

## 2012-10-27 DIAGNOSIS — M4000 Postural kyphosis, site unspecified: Secondary | ICD-10-CM | POA: Diagnosis not present

## 2012-10-27 DIAGNOSIS — N390 Urinary tract infection, site not specified: Secondary | ICD-10-CM | POA: Diagnosis not present

## 2012-10-27 DIAGNOSIS — D510 Vitamin B12 deficiency anemia due to intrinsic factor deficiency: Secondary | ICD-10-CM | POA: Diagnosis not present

## 2012-10-28 DIAGNOSIS — R5381 Other malaise: Secondary | ICD-10-CM | POA: Diagnosis not present

## 2012-10-28 DIAGNOSIS — D510 Vitamin B12 deficiency anemia due to intrinsic factor deficiency: Secondary | ICD-10-CM | POA: Diagnosis not present

## 2012-10-28 DIAGNOSIS — M4000 Postural kyphosis, site unspecified: Secondary | ICD-10-CM | POA: Diagnosis not present

## 2012-10-28 DIAGNOSIS — N390 Urinary tract infection, site not specified: Secondary | ICD-10-CM | POA: Diagnosis not present

## 2012-10-29 DIAGNOSIS — R5381 Other malaise: Secondary | ICD-10-CM | POA: Diagnosis not present

## 2012-10-29 DIAGNOSIS — M4000 Postural kyphosis, site unspecified: Secondary | ICD-10-CM | POA: Diagnosis not present

## 2012-10-29 DIAGNOSIS — N390 Urinary tract infection, site not specified: Secondary | ICD-10-CM | POA: Diagnosis not present

## 2012-10-29 DIAGNOSIS — D510 Vitamin B12 deficiency anemia due to intrinsic factor deficiency: Secondary | ICD-10-CM | POA: Diagnosis not present

## 2012-11-01 DIAGNOSIS — N390 Urinary tract infection, site not specified: Secondary | ICD-10-CM | POA: Diagnosis not present

## 2012-11-01 DIAGNOSIS — D510 Vitamin B12 deficiency anemia due to intrinsic factor deficiency: Secondary | ICD-10-CM | POA: Diagnosis not present

## 2012-11-01 DIAGNOSIS — M4000 Postural kyphosis, site unspecified: Secondary | ICD-10-CM | POA: Diagnosis not present

## 2012-11-01 DIAGNOSIS — R5381 Other malaise: Secondary | ICD-10-CM | POA: Diagnosis not present

## 2012-11-03 DIAGNOSIS — N390 Urinary tract infection, site not specified: Secondary | ICD-10-CM | POA: Diagnosis not present

## 2012-11-03 DIAGNOSIS — M4000 Postural kyphosis, site unspecified: Secondary | ICD-10-CM | POA: Diagnosis not present

## 2012-11-03 DIAGNOSIS — D510 Vitamin B12 deficiency anemia due to intrinsic factor deficiency: Secondary | ICD-10-CM | POA: Diagnosis not present

## 2012-11-03 DIAGNOSIS — R5381 Other malaise: Secondary | ICD-10-CM | POA: Diagnosis not present

## 2012-11-04 DIAGNOSIS — M4000 Postural kyphosis, site unspecified: Secondary | ICD-10-CM | POA: Diagnosis not present

## 2012-11-04 DIAGNOSIS — R5381 Other malaise: Secondary | ICD-10-CM | POA: Diagnosis not present

## 2012-11-04 DIAGNOSIS — N390 Urinary tract infection, site not specified: Secondary | ICD-10-CM | POA: Diagnosis not present

## 2012-11-04 DIAGNOSIS — D510 Vitamin B12 deficiency anemia due to intrinsic factor deficiency: Secondary | ICD-10-CM | POA: Diagnosis not present

## 2012-11-09 DIAGNOSIS — L219 Seborrheic dermatitis, unspecified: Secondary | ICD-10-CM | POA: Diagnosis not present

## 2012-11-09 DIAGNOSIS — C44319 Basal cell carcinoma of skin of other parts of face: Secondary | ICD-10-CM | POA: Diagnosis not present

## 2012-11-17 ENCOUNTER — Ambulatory Visit (INDEPENDENT_AMBULATORY_CARE_PROVIDER_SITE_OTHER): Payer: Medicare Other | Admitting: Cardiology

## 2012-11-17 ENCOUNTER — Encounter: Payer: Self-pay | Admitting: Cardiology

## 2012-11-17 VITALS — BP 127/62 | HR 73 | Ht 66.0 in | Wt 106.8 lb

## 2012-11-17 DIAGNOSIS — I712 Thoracic aortic aneurysm, without rupture: Secondary | ICD-10-CM

## 2012-11-17 DIAGNOSIS — E785 Hyperlipidemia, unspecified: Secondary | ICD-10-CM | POA: Diagnosis not present

## 2012-11-17 DIAGNOSIS — R002 Palpitations: Secondary | ICD-10-CM

## 2012-11-17 NOTE — Patient Instructions (Addendum)
Your physician recommends that you return for lab work: BMET/TSH/Lipid profile  Your physician has requested that you have an echocardiogram in December. Echocardiography is a painless test that uses sound waves to create images of your heart. It provides your doctor with information about the size and shape of your heart and how well your heart's chambers and valves are working. This procedure takes approximately one hour. There are no restrictions for this procedure.  Your physician has recommended that you wear an event monitor. Event monitors are medical devices that record the heart's electrical activity. Doctors most often [place] these monitors to diagnose arrhythmias. Arrhythmias are problems with the speed or rhythm of the heartbeat. The monitor is a small, portable device. You can wear one while you do your normal daily activities. This is usually used to diagnose what is causing palpitations/syncope (passing out).  Your physician recommends that you schedule a follow-up appointment in: one month with Dr. Delton See

## 2012-11-17 NOTE — Progress Notes (Signed)
Patient ID: Hassell Halim, female   DOB: 04-Sep-1931, 79 y.o.   MRN: [id number]    Patient Name: Kelsey Griffin Date of Encounter: 11/17/2012  Primary Care Provider:  Philemon Kingdom, MD Primary Cardiologist:  Tobias Alexander, H   Patient Profile  Palpitations, ascending thoracic aneurysm  Problem List   Past Medical History  Diagnosis Date  . Fibromyalgia   . Osteoporosis   . Mitral valve prolapse   . Aortic stenosis     mild  . Bladder infection, chronic   . Diabetes mellitus, type 2   . Depression   . GERD (gastroesophageal reflux disease)   . Insomnia   . Anxiety   . Stroke   . Arthritis   . Seasonal allergies    Past Surgical History  Procedure Laterality Date  . Ovarian cyst resection on left  1958  . Bladder tack  1959, [id number]  . Breast lump removed  1962, 1983  . Appendectomy    . Eye surgery    . Hernia repair    . Tubal ligation      Allergies  Allergies  Allergen Reactions  . Bisphosphonates     REACTION: states intol to all bisphos \\T \ Evista too...  . Codeine     REACTION: nausea  . Escitalopram Oxalate     REACTION: nervousness  . Milnacipran     REACTION: blurred vision, cough  . Penicillins     REACTION: rash  . Venlafaxine     REACTION: vomiting and flusing    HPI  A very pleasant 79 year old female who is coming to establish cardiology care for concerns of palpitations. The patient has been followed by CT surgeon Dr. Tyrone Sage for ascending aortic aneurysm. She had chest CT done in 2005 por a paroxysmal cough that showed an ascending aortic aneurysm measuring 4.1 cm and a followup scan in 2012 measuring ascending aorta at 4.3-4.5 cm. At that time she had a transthoracic echocardiogram performed that showed only mild aortic insufficiency and left ventricle with normal left ventricular function and size and she is to be followed in 1 year (in December 2014).  Today she is coming for concerns of palpitations. She describes palpitations  that usually start suddenly and slowly increase in frequency. The patient is more aware of them at night when she lays down. They might last for up to 45 minutes at a time. There is no associated chest pain or shortness of breath no prior syncopal episodes. However patient baseline functional capacity is low. She is walking with wheelchair and she is on 2 L of home oxygen secondary to emphysema as a result of secondhand smoking. She has a history of episodes of oxygen desaturation when lying flat.  The patient also describes left arm pain that is not associated with palpitations and she attributes it to fibromyalgia.    Home Medications  Prior to Admission medications   Medication Sig Start Date End Date Taking? Authorizing Provider  ALPRAZolam Prudy Feeler) 0.5 MG tablet Take 0.5 mg by mouth 3 (three) times daily as needed.    Yes Historical Provider, MD  amitriptyline (ELAVIL) 25 MG tablet Take 25 mg by mouth at bedtime.   Yes Historical Provider, MD  aspirin 81 MG tablet Take 81 mg by mouth 2 (two) times daily.    Yes Historical Provider, MD  Calcium Carb-Cholecalciferol (CALCIUM 500 +D) 500-400 MG-UNIT TABS Take by mouth 2 (two) times daily.   Yes Historical Provider, MD  co-enzyme Q-10 30 MG  capsule Take 30 mg by mouth 2 (two) times daily.    Yes Historical Provider, MD  cyanocobalamin 1000 MCG tablet Take 100 mcg by mouth daily.   Yes Historical Provider, MD  fish oil-omega-3 fatty acids 1000 MG capsule Take 2 g by mouth daily.   Yes Historical Provider, MD  Magnesium 100 MG TABS Take by mouth daily.   Yes Historical Provider, MD  Manganese 50 MG TABS Take by mouth daily.   Yes Historical Provider, MD  Multiple Vitamins-Minerals (MULTIVITAMIN PO) Take by mouth 2 (two) times daily.    Yes Historical Provider, MD  vitamin E 400 UNIT capsule Take 400 Units by mouth every other day.    Yes Historical Provider, MD    Family History  Family History  Problem Relation Age of Onset  . Diabetes  Maternal Grandmother   . Heart disease Maternal Grandmother   . Heart attack Maternal Grandmother   . Breast cancer Sister   . Diabetes Sister   . Ovarian cancer Sister   . GER disease Sister   . Anxiety disorder Sister   . Diabetes Daughter   . Anxiety disorder Daughter   . Kidney disease Mother     Social History  History   Social History  . Marital Status: Married    Spouse Name: N/A    Number of Children: 3  . Years of Education: N/A   Occupational History  . retired    Social History Main Topics  . Smoking status: Never Smoker   . Smokeless tobacco: Never Used  . Alcohol Use: Not on file  . Drug Use: Not on file  . Sexual Activity: Not on file   Other Topics Concern  . Not on file   Social History Narrative   Lives with Husband     Review of Systems General:  No chills, fever, night sweats or weight changes.  Cardiovascular:  No chest pain, + dyspnea on exertion, edema, orthopnea, palpitations, + paroxysmal nocturnal dyspnea. Dermatological: No rash, lesions/masses Respiratory: No cough, dyspnea Urologic: No hematuria, dysuria Abdominal:   No nausea, vomiting, diarrhea, bright red blood per rectum, melena, or hematemesis Neurologic:  No visual changes, wkns, changes in mental status. All other systems reviewed and are otherwise negative except as noted above.  Physical Exam  Blood pressure 127/62, pulse 73, height 5\' 6"  (1.676 m), weight 106 lb 12.8 oz (48.444 kg).  General: Pleasant, NAD, walking with a walker Psych: Normal affect. Neuro: Alert and oriented X 3. Moves all extremities spontaneously. HEENT: Normal  Neck: Supple without bruits or JVD. Lungs:  Resp regular and unlabored, CTA. Heart: RRR no s3, s4, short diastolic murmur at the right upper sternal border Abdomen: Soft, non-tender, non-distended, BS + x 4.  Extremities: No clubbing, cyanosis, minimal nonpitting edema on lower extremities. DP/PT/Radials 2+ and equal  bilaterally.  Accessory Clinical Findings  ECG - SR, possible LAE, otherwise normal  TTE 02/11/2012 Left ventricle: Mild hypokinesis at the base of the inferior wall. Upper septal thickening. The cavity size was normal. Wall thickness was increased in a pattern of mild LVH. The estimated ejection fraction was 60%. Doppler parameters are consistent with abnormal left ventricular relaxation (grade 1 diastolic dysfunction). - Aortic valve: Mild regurgitation. - Mitral valve: Mild prolapse posterior leaflet. Mild MR.   Assessment & Plan  79 year old female  1. Palpitations - there are multiple reasons why this patient might have palpitations. One of them being atrial fibrillation as her echo showed a  mildly enlarged left atrium so that does her EKG. She also has a prior history of stroke that would support the diagnosis. Since her episodes are now less frequent we will give her event monitor for the first 30 days. The bladder possibility is that her episodes are being initiated by episodes of hypoxia that she is experiencing when laying supine. Based on the findings we might need to consider anticoagulation in the future. We will check a basic metabolic profile and TSH.  2. Ascending thoracic aneurysm - associated with mild aortic insufficiency. Currently the size doesn't qualify for any surgical intervention. The fact that in 7 years the aneurysm grew only 3 mm is reassuring that these aneurysm is slowly progressing. We will reorder her echocardiogram in early December prior to her CT surgery appointment to follow on the degree of aortic insufficiency and size of ascending aorta.  3. Lipid profile - the patient hasn't had lipids checked in a while we will do so today and follow on the results.  Followup in one month.    Tobias Alexander, Rexene Edison, MD 11/17/2012, 5:10 PM

## 2012-11-22 ENCOUNTER — Encounter: Payer: Self-pay | Admitting: *Deleted

## 2012-11-22 ENCOUNTER — Other Ambulatory Visit (INDEPENDENT_AMBULATORY_CARE_PROVIDER_SITE_OTHER): Payer: Medicare Other

## 2012-11-22 ENCOUNTER — Encounter (INDEPENDENT_AMBULATORY_CARE_PROVIDER_SITE_OTHER): Payer: Medicare Other

## 2012-11-22 DIAGNOSIS — E785 Hyperlipidemia, unspecified: Secondary | ICD-10-CM

## 2012-11-22 DIAGNOSIS — R002 Palpitations: Secondary | ICD-10-CM

## 2012-11-22 LAB — BASIC METABOLIC PANEL
BUN: 11 mg/dL (ref 6–23)
CO2: 37 mEq/L — ABNORMAL HIGH (ref 19–32)
Calcium: 10 mg/dL (ref 8.4–10.5)
Chloride: 102 mEq/L (ref 96–112)
Creatinine, Ser: 0.6 mg/dL (ref 0.4–1.2)
GFR: 98.71 mL/min (ref >60.00)
Glucose, Bld: 77 mg/dL (ref 70–99)
Potassium: 3.5 mEq/L (ref 3.5–5.1)
Sodium: 139 mEq/L (ref 135–145)

## 2012-11-22 LAB — LIPID PANEL
Cholesterol: 190 mg/dL (ref 0–200)
HDL: 54.9 mg/dL (ref >39.00)
LDL Cholesterol: 119 mg/dL — ABNORMAL HIGH (ref 0–99)
Total CHOL/HDL Ratio: 3
Triglycerides: 83 mg/dL (ref 0.0–149.0)
VLDL: 16.6 mg/dL (ref 0.0–40.0)

## 2012-11-22 LAB — TSH: TSH: 0.34 u[IU]/mL — ABNORMAL LOW (ref 0.35–5.50)

## 2012-11-22 NOTE — Progress Notes (Signed)
Patient ID: Kelsey Griffin, female   DOB: 01-08-1932, 79 y.o.   MRN: [id number] E-Cardio Verite 30 day cardiac event monitor applied to patient.

## 2012-11-23 ENCOUNTER — Telehealth: Payer: Self-pay | Admitting: Cardiology

## 2012-11-23 ENCOUNTER — Other Ambulatory Visit: Payer: Self-pay

## 2012-11-23 DIAGNOSIS — E785 Hyperlipidemia, unspecified: Secondary | ICD-10-CM

## 2012-11-23 NOTE — Telephone Encounter (Signed)
New problem    Patient calling stating someone call her today

## 2012-11-23 NOTE — Telephone Encounter (Signed)
Pt given lab results. Per her request a copy of her lab work has been mailed to her address.

## 2012-11-30 DIAGNOSIS — M999 Biomechanical lesion, unspecified: Secondary | ICD-10-CM | POA: Diagnosis not present

## 2012-11-30 DIAGNOSIS — M5126 Other intervertebral disc displacement, lumbar region: Secondary | ICD-10-CM | POA: Diagnosis not present

## 2012-12-01 ENCOUNTER — Ambulatory Visit (INDEPENDENT_AMBULATORY_CARE_PROVIDER_SITE_OTHER): Payer: Medicare Other | Admitting: *Deleted

## 2012-12-01 DIAGNOSIS — E785 Hyperlipidemia, unspecified: Secondary | ICD-10-CM

## 2012-12-01 LAB — T4, FREE: Free T4: 2.26 ng/dL — ABNORMAL HIGH (ref 0.60–1.60)

## 2012-12-07 ENCOUNTER — Other Ambulatory Visit: Payer: Self-pay

## 2012-12-07 MED ORDER — METHIMAZOLE 5 MG PO TABS: 5.0000 mg | ORAL_TABLET | Freq: Three times a day (TID) | ORAL | Status: DC

## 2012-12-07 MED ORDER — ATENOLOL 25 MG PO TABS: 25.0000 mg | ORAL_TABLET | Freq: Every day | ORAL | Status: DC

## 2012-12-14 DIAGNOSIS — M5126 Other intervertebral disc displacement, lumbar region: Secondary | ICD-10-CM | POA: Diagnosis not present

## 2012-12-14 DIAGNOSIS — M999 Biomechanical lesion, unspecified: Secondary | ICD-10-CM | POA: Diagnosis not present

## 2012-12-16 ENCOUNTER — Ambulatory Visit: Payer: Medicare Other | Admitting: Endocrinology

## 2012-12-16 DIAGNOSIS — Z0289 Encounter for other administrative examinations: Secondary | ICD-10-CM

## 2012-12-22 ENCOUNTER — Ambulatory Visit (INDEPENDENT_AMBULATORY_CARE_PROVIDER_SITE_OTHER): Payer: Medicare Other | Admitting: Endocrinology

## 2012-12-22 ENCOUNTER — Telehealth: Payer: Self-pay

## 2012-12-22 ENCOUNTER — Encounter: Payer: Self-pay | Admitting: Endocrinology

## 2012-12-22 ENCOUNTER — Encounter: Payer: Self-pay | Admitting: Cardiology

## 2012-12-22 ENCOUNTER — Ambulatory Visit (INDEPENDENT_AMBULATORY_CARE_PROVIDER_SITE_OTHER): Payer: Medicare Other | Admitting: Cardiology

## 2012-12-22 VITALS — BP 125/59 | HR 80 | Ht 64.0 in | Wt 105.0 lb

## 2012-12-22 VITALS — BP 104/68 | Temp 97.4°F | Ht 64.0 in | Wt 106.0 lb

## 2012-12-22 DIAGNOSIS — E0590 Thyrotoxicosis, unspecified without thyrotoxic crisis or storm: Secondary | ICD-10-CM | POA: Insufficient documentation

## 2012-12-22 DIAGNOSIS — E785 Hyperlipidemia, unspecified: Secondary | ICD-10-CM | POA: Diagnosis not present

## 2012-12-22 DIAGNOSIS — R002 Palpitations: Secondary | ICD-10-CM | POA: Diagnosis not present

## 2012-12-22 DIAGNOSIS — Z23 Encounter for immunization: Secondary | ICD-10-CM

## 2012-12-22 NOTE — Patient Instructions (Addendum)
For now, please stop taking the methimazole. Please come back for a follow-up appointment in January.  most of the time, a "lumpy thyroid" will eventually become overactive again.

## 2012-12-22 NOTE — Patient Instructions (Signed)
Your physician wants you to follow-up in:  12 months.  You will receive a reminder letter in the mail two months in advance. If you don't receive a letter, please call our office to schedule the follow-up appointment.   

## 2012-12-22 NOTE — Progress Notes (Signed)
Subjective:    Patient ID: Hassell Halim, female    DOB: Feb 21, 1932, 79 y.o.   MRN: [id number]  HPI 1 month ago, pt was noted to have slightly low TSH.  Prior values have been normal.  She has tremor of the hands, and assoc cold intolerance.  She started taking tapazole a few weeks ago.  Since then, she reports increased fatigue. Past Medical History  Diagnosis Date  . Fibromyalgia   . Osteoporosis   . Mitral valve prolapse   . Aortic stenosis     mild  . Bladder infection, chronic   . Diabetes mellitus, type 2   . Depression   . GERD (gastroesophageal reflux disease)   . Insomnia   . Anxiety   . Stroke   . Arthritis   . Seasonal allergies     Past Surgical History  Procedure Laterality Date  . Ovarian cyst resection on left  1958  . Bladder tack  1959, [id number]  . Breast lump removed  1962, 1983  . Appendectomy    . Eye surgery    . Hernia repair    . Tubal ligation      History   Social History  . Marital Status: Married    Spouse Name: N/A    Number of Children: 3  . Years of Education: N/A   Occupational History  . retired    Social History Main Topics  . Smoking status: Never Smoker   . Smokeless tobacco: Never Used  . Alcohol Use: Not on file  . Drug Use: Not on file  . Sexual Activity: Not on file   Other Topics Concern  . Not on file   Social History Narrative   Lives with Husband    Current Outpatient Prescriptions on File Prior to Visit  Medication Sig Dispense Refill  . ALPRAZolam (XANAX) 0.5 MG tablet Take 0.5 mg by mouth 3 (three) times daily as needed.       Marland Kitchen amitriptyline (ELAVIL) 25 MG tablet Take 25 mg by mouth at bedtime.      Marland Kitchen aspirin 81 MG tablet Take 81 mg by mouth 2 (two) times daily.       Marland Kitchen atenolol (TENORMIN) 25 MG tablet Take 1 tablet (25 mg total) by mouth daily.  30 tablet  3  . Calcium Carb-Cholecalciferol (CALCIUM 500 +D) 500-400 MG-UNIT TABS Take by mouth 2 (two) times daily.      Marland Kitchen co-enzyme Q-10 30 MG capsule Take 30  mg by mouth 2 (two) times daily.       . cyanocobalamin 1000 MCG tablet Take 100 mcg by mouth daily.      . fish oil-omega-3 fatty acids 1000 MG capsule Take 2 g by mouth daily.      . Magnesium 100 MG TABS Take by mouth daily.      . Manganese 50 MG TABS Take by mouth daily.      . methimazole (TAPAZOLE) 5 MG tablet Take 1 tablet (5 mg total) by mouth 3 (three) times daily.  90 tablet  3  . Multiple Vitamins-Minerals (MULTIVITAMIN PO) Take by mouth 2 (two) times daily.       . vitamin E 400 UNIT capsule Take 400 Units by mouth every other day.        No current facility-administered medications on file prior to visit.    Allergies  Allergen Reactions  . Bisphosphonates     REACTION: states intol to all bisphos \\T \ Evista too...  Marland Kitchen  Codeine     REACTION: nausea  . Escitalopram Oxalate     REACTION: nervousness  . Milnacipran     REACTION: blurred vision, cough  . Penicillins     REACTION: rash  . Venlafaxine     REACTION: vomiting and flusing    Family History  Problem Relation Age of Onset  . Diabetes Maternal Grandmother   . Heart disease Maternal Grandmother   . Heart attack Maternal Grandmother   . Breast cancer Sister   . Diabetes Sister   . Ovarian cancer Sister   . GER disease Sister   . Anxiety disorder Sister   . Diabetes Daughter   . Anxiety disorder Daughter   . Kidney disease Mother   father had hypothyroidism Sister has multinodular goiter  BP 104/68  Temp(Src) 97.4 F (36.3 C) (Oral)  Ht 5\' 4"  (1.626 m)  Wt 106 lb (48.081 kg)  BMI 18.19 kg/m2  Review of Systems She reports hair loss, palpitations, arthralgias, and weight loss (13 lbs).  denies headache, hoarseness, visual loss, sob, diarrhea, polyuria, excessive diaphoresis, numbness, anxiety, menopausal sxs, easy bruising, and rhinorrhea.      Objective:   Physical Exam VS: see vs page GEN: no distress HEAD: head: no deformity eyes: no periorbital swelling, no proptosis external nose and ears  are normal mouth: no lesion seen NECK: supple, thyroid is not enlarged CHEST WALL: no deformity LUNGS:  Clear to auscultation.   CV: reg rate and rhythm, no murmur ABD: abdomen is soft, nontender.  no hepatosplenomegaly.  not distended.  no hernia MUSCULOSKELETAL: muscle bulk and strength are grossly normal.  no obvious joint swelling.  gait is steady with a walker.  EXTEMITIES: no deformity. no edema PULSES: dorsalis pedis intact bilat.  no carotid bruit NEURO:  cn 2-12 grossly intact.   readily moves all 4's.  sensation is intact to touch on all 4's.  No tremor. SKIN:  Normal texture and temperature.  No rash or suspicious lesion is visible.   NODES:  None palpable at the neck PSYCH: alert, oriented x3.  Does not appear anxious nor depressed.  Lab Results  Component Value Date   TSH 0.34* 11/22/2012      Assessment & Plan:  Hyperthyroidism, mild, uncertain etiology.  This is usually due to multinodular goiter. Fatigue, not due to tapazole.  However, as abnormality of TSH is mild, it is OK for her to go of, even as the abnormality will most likely recur with time. Weight loss: not thyroid-related Hair loss: not thyroid-related.

## 2012-12-22 NOTE — Progress Notes (Signed)
Patient ID: Hassell Halim, female   DOB: [birth year], 79 y.o.   MRN: [id number]   Patient Name: Kelsey Griffin Date of Encounter: 12/22/2012  Primary Care Provider:  Philemon Kingdom, MD Primary Cardiologist:  Tobias Alexander, H   Patient Profile  Palpitations, ascending thoracic aneurysm  Problem List   Past Medical History  Diagnosis Date  . Fibromyalgia   . Osteoporosis   . Mitral valve prolapse   . Aortic stenosis     mild  . Bladder infection, chronic   . Diabetes mellitus, type 2   . Depression   . GERD (gastroesophageal reflux disease)   . Insomnia   . Anxiety   . Stroke   . Arthritis   . Seasonal allergies    Past Surgical History  Procedure Laterality Date  . Ovarian cyst resection on left  1958  . Bladder tack  1959, [id number]  . Breast lump removed  1962, 1983  . Appendectomy    . Eye surgery    . Hernia repair    . Tubal ligation      Allergies  Allergies  Allergen Reactions  . Bisphosphonates     REACTION: states intol to all bisphos \\T \ Evista too...  . Codeine     REACTION: nausea  . Escitalopram Oxalate     REACTION: nervousness  . Milnacipran     REACTION: blurred vision, cough  . Penicillins     REACTION: rash  . Venlafaxine     REACTION: vomiting and flusing    HPI  A very pleasant 79 year old female who is coming to establish cardiology care for concerns of palpitations. The patient has been followed by CT surgeon Dr. Tyrone Sage for ascending aortic aneurysm. She had chest CT done in 2005 por a paroxysmal cough that showed an ascending aortic aneurysm measuring 4.1 cm and a followup scan in 2012 measuring ascending aorta at 4.3-4.5 cm. At that time she had a transthoracic echocardiogram performed that showed only mild aortic insufficiency and left ventricle with normal left ventricular function and size and she is to be followed in 1 year (in December 2014).  Today she is coming for concerns of palpitations. She describes  palpitations that usually start suddenly and slowly increase in frequency. The patient is more aware of them at night when she lays down. They might last for up to 45 minutes at a time. There is no associated chest pain or shortness of breath no prior syncopal episodes. However patient baseline functional capacity is low. She is walking with wheelchair and she is on 2 L of home oxygen secondary to emphysema as a result of secondhand smoking. She has a history of episodes of oxygen desaturation when lying flat.  The patient also describes left arm pain that is not associated with palpitations and she attributes it to fibromyalgia.    This is a one-month followup for reviewing the results of cardiac monitoring. The patient states that she had palpitations while she was very heart monitor. No syncope.  Home Medications  Prior to Admission medications   Medication Sig Start Date End Date Taking? Authorizing Provider  ALPRAZolam Prudy Feeler) 0.5 MG tablet Take 0.5 mg by mouth 3 (three) times daily as needed.    Yes Historical Provider, MD  amitriptyline (ELAVIL) 25 MG tablet Take 25 mg by mouth at bedtime.   Yes Historical Provider, MD  aspirin 81 MG tablet Take 81 mg by mouth 2 (two) times daily.    Yes Historical  Provider, MD  Calcium Carb-Cholecalciferol (CALCIUM 500 +D) 500-400 MG-UNIT TABS Take by mouth 2 (two) times daily.   Yes Historical Provider, MD  co-enzyme Q-10 30 MG capsule Take 30 mg by mouth 2 (two) times daily.    Yes Historical Provider, MD  cyanocobalamin 1000 MCG tablet Take 100 mcg by mouth daily.   Yes Historical Provider, MD  fish oil-omega-3 fatty acids 1000 MG capsule Take 2 g by mouth daily.   Yes Historical Provider, MD  Magnesium 100 MG TABS Take by mouth daily.   Yes Historical Provider, MD  Manganese 50 MG TABS Take by mouth daily.   Yes Historical Provider, MD  Multiple Vitamins-Minerals (MULTIVITAMIN PO) Take by mouth 2 (two) times daily.    Yes Historical Provider, MD    vitamin E 400 UNIT capsule Take 400 Units by mouth every other day.    Yes Historical Provider, MD    Family History  Family History  Problem Relation Age of Onset  . Diabetes Maternal Grandmother   . Heart disease Maternal Grandmother   . Heart attack Maternal Grandmother   . Breast cancer Sister   . Diabetes Sister   . Ovarian cancer Sister   . GER disease Sister   . Anxiety disorder Sister   . Diabetes Daughter   . Anxiety disorder Daughter   . Kidney disease Mother     Social History  History   Social History  . Marital Status: Married    Spouse Name: N/A    Number of Children: 3  . Years of Education: N/A   Occupational History  . retired    Social History Main Topics  . Smoking status: Never Smoker   . Smokeless tobacco: Never Used  . Alcohol Use: Not on file  . Drug Use: Not on file  . Sexual Activity: Not on file   Other Topics Concern  . Not on file   Social History Narrative   Lives with Husband     Review of Systems, as per history of present illness otherwise negative General:  No chills, fever, night sweats or weight changes.  Cardiovascular:  No chest pain, + dyspnea on exertion, edema, orthopnea, palpitations, + paroxysmal nocturnal dyspnea. Dermatological: No rash, lesions/masses Respiratory: No cough, dyspnea Urologic: No hematuria, dysuria Abdominal:   No nausea, vomiting, diarrhea, bright red blood per rectum, melena, or hematemesis Neurologic:  No visual changes, wkns, changes in mental status. All other systems reviewed and are otherwise negative except as noted above.  Physical Exam  Blood pressure 125/59, pulse 80, height 5\' 4"  (1.626 m), weight 105 lb (47.628 kg).  General: Pleasant, NAD, walking with a walker Psych: Normal affect. Neuro: Alert and oriented X 3. Moves all extremities spontaneously. HEENT: Normal  Neck: Supple without bruits or JVD. Lungs:  Resp regular and unlabored, CTA. Heart: RRR no s3, s4, short diastolic  murmur at the right upper sternal border Abdomen: Soft, non-tender, non-distended, BS + x 4.  Extremities: No clubbing, cyanosis, minimal nonpitting edema on lower extremities. DP/PT/Radials 2+ and equal bilaterally.  Accessory Clinical Findings  ECG - SR, possible LAE, otherwise normal  TTE 02/11/2012 Left ventricle: Mild hypokinesis at the base of the inferior wall. Upper septal thickening. The cavity size was normal. Wall thickness was increased in a pattern of mild LVH. The estimated ejection fraction was 60%. Doppler parameters are consistent with abnormal left ventricular relaxation (grade 1 diastolic dysfunction). - Aortic valve: Mild regurgitation. - Mitral valve: Mild prolapse posterior leaflet. Mild  MR.  Lipid Panel     Component Value Date/Time   CHOL 190 11/22/2012 1332   TRIG 83.0 11/22/2012 1332   HDL 54.90 11/22/2012 1332   CHOLHDL 3 11/22/2012 1332   VLDL 16.6 11/22/2012 1332   LDLCALC 119* 11/22/2012 1332    Assessment & Plan  79 year old female  1. Palpitations - 30 day cardiac monitor didn't show any arrhytmias and the patient states that she had them while wearing the monitor. There were occasional PVCs.  TSH is 0.34 and free T 4 is 2.26. The patient was seen by an endocrinologist today and he recommended not to take any higher suppressants as the hyper thyroidism is very mild.  2. Ascending thoracic aneurysm - associated with mild aortic insufficiency. Currently the size doesn't qualify for any surgical intervention. The fact that in 7 years the aneurysm grew only 3 mm is reassuring that these aneurysm is slowly progressing. She has a one-year followup echocardiogram scheduled for December prior to her CT surgery appointment to follow on the degree of aortic insufficiency and size of ascending aorta. We will follow the results.  3. Lipid profile - HDL and TAG normal, LDL high, the patient prefers lifestyle modification as opposed to initiation of  statins.  Followup in 1 year.   Tobias Alexander, Rexene Edison, MD 12/22/2012, 4:32 PM

## 2012-12-22 NOTE — Telephone Encounter (Signed)
Error

## 2013-01-13 ENCOUNTER — Other Ambulatory Visit: Payer: Self-pay | Admitting: *Deleted

## 2013-01-13 DIAGNOSIS — I059 Rheumatic mitral valve disease, unspecified: Secondary | ICD-10-CM

## 2013-01-27 DIAGNOSIS — M999 Biomechanical lesion, unspecified: Secondary | ICD-10-CM | POA: Diagnosis not present

## 2013-01-27 DIAGNOSIS — M5126 Other intervertebral disc displacement, lumbar region: Secondary | ICD-10-CM | POA: Diagnosis not present

## 2013-02-07 ENCOUNTER — Other Ambulatory Visit (HOSPITAL_COMMUNITY): Payer: Medicare Other

## 2013-03-03 ENCOUNTER — Ambulatory Visit: Payer: Medicare Other | Admitting: Cardiothoracic Surgery

## 2013-03-03 ENCOUNTER — Other Ambulatory Visit: Payer: Medicare Other

## 2013-03-08 DIAGNOSIS — N390 Urinary tract infection, site not specified: Secondary | ICD-10-CM | POA: Diagnosis not present

## 2013-03-17 DIAGNOSIS — M5126 Other intervertebral disc displacement, lumbar region: Secondary | ICD-10-CM | POA: Diagnosis not present

## 2013-03-17 DIAGNOSIS — M999 Biomechanical lesion, unspecified: Secondary | ICD-10-CM | POA: Diagnosis not present

## 2013-03-21 DIAGNOSIS — R109 Unspecified abdominal pain: Secondary | ICD-10-CM | POA: Diagnosis not present

## 2013-03-21 DIAGNOSIS — F411 Generalized anxiety disorder: Secondary | ICD-10-CM | POA: Diagnosis not present

## 2013-03-21 DIAGNOSIS — N814 Uterovaginal prolapse, unspecified: Secondary | ICD-10-CM | POA: Diagnosis not present

## 2013-03-21 DIAGNOSIS — K5900 Constipation, unspecified: Secondary | ICD-10-CM | POA: Diagnosis not present

## 2013-03-21 DIAGNOSIS — N390 Urinary tract infection, site not specified: Secondary | ICD-10-CM | POA: Diagnosis not present

## 2013-03-24 ENCOUNTER — Ambulatory Visit: Payer: Medicare Other | Admitting: Endocrinology

## 2013-04-01 DIAGNOSIS — N814 Uterovaginal prolapse, unspecified: Secondary | ICD-10-CM | POA: Diagnosis not present

## 2013-05-03 DIAGNOSIS — M5126 Other intervertebral disc displacement, lumbar region: Secondary | ICD-10-CM | POA: Diagnosis not present

## 2013-05-03 DIAGNOSIS — M999 Biomechanical lesion, unspecified: Secondary | ICD-10-CM | POA: Diagnosis not present

## 2013-06-15 ENCOUNTER — Encounter: Payer: Self-pay | Admitting: Endocrinology

## 2013-06-15 ENCOUNTER — Ambulatory Visit (INDEPENDENT_AMBULATORY_CARE_PROVIDER_SITE_OTHER): Payer: Medicare Other | Admitting: Endocrinology

## 2013-06-15 VITALS — BP 110/70 | HR 76 | Temp 97.8°F | Ht 64.0 in | Wt 99.0 lb

## 2013-06-15 DIAGNOSIS — E0590 Thyrotoxicosis, unspecified without thyrotoxic crisis or storm: Secondary | ICD-10-CM | POA: Diagnosis not present

## 2013-06-15 NOTE — Progress Notes (Signed)
Subjective:    Patient ID: Kelsey Griffin, female    DOB: 1931-09-11, 80 y.o.   MRN: [id number]  HPI In 2014, pt was noted to have slightly low TSH.  Prior values had been normal.  She has never had thyroid imaging.  In 2014, she took tapazole a few weeks only.  It was stopped due to improved TFT.  Her mother had a goiter.  Pt does not notice any swelling at the neck.   Past Medical History  Diagnosis Date  . Fibromyalgia   . Osteoporosis   . Mitral valve prolapse   . Aortic stenosis     mild  . Bladder infection, chronic   . Diabetes mellitus, type 2   . Depression   . GERD (gastroesophageal reflux disease)   . Insomnia   . Anxiety   . Stroke   . Arthritis   . Seasonal allergies     Past Surgical History  Procedure Laterality Date  . Ovarian cyst resection on left  1958  . Bladder tack  1959, [id number]  . Breast lump removed  1962, 1983  . Appendectomy    . Eye surgery    . Hernia repair    . Tubal ligation      History   Social History  . Marital Status: Married    Spouse Name: N/A    Number of Children: 3  . Years of Education: N/A   Occupational History  . retired    Social History Main Topics  . Smoking status: Never Smoker   . Smokeless tobacco: Never Used  . Alcohol Use: Not on file  . Drug Use: Not on file  . Sexual Activity: Not on file   Other Topics Concern  . Not on file   Social History Narrative   Lives with Husband    Current Outpatient Prescriptions on File Prior to Visit  Medication Sig Dispense Refill  . ALPRAZolam (XANAX) 0.5 MG tablet Take 0.5 mg by mouth 3 (three) times daily as needed.       Marland Kitchen amitriptyline (ELAVIL) 25 MG tablet Take 25 mg by mouth at bedtime.      Marland Kitchen aspirin 81 MG tablet Take 81 mg by mouth 2 (two) times daily.       Marland Kitchen atenolol (TENORMIN) 25 MG tablet Take 1 tablet (25 mg total) by mouth daily.  30 tablet  3  . Calcium Carb-Cholecalciferol (CALCIUM 500 +D) 500-400 MG-UNIT TABS Take by mouth 2 (two) times daily.       Marland Kitchen co-enzyme Q-10 30 MG capsule Take 30 mg by mouth 2 (two) times daily.       . cyanocobalamin 1000 MCG tablet Take 100 mcg by mouth daily.      . fish oil-omega-3 fatty acids 1000 MG capsule Take 2 g by mouth daily.      . Magnesium 100 MG TABS Take by mouth daily.      . Manganese 50 MG TABS Take by mouth daily.      . Multiple Vitamins-Minerals (MULTIVITAMIN PO) Take by mouth 2 (two) times daily.       . vitamin E 400 UNIT capsule Take 400 Units by mouth every other day.        No current facility-administered medications on file prior to visit.    Allergies  Allergen Reactions  . Bisphosphonates     REACTION: states intol to all bisphos \\T \ Evista too...  . Codeine     REACTION: nausea  .  Escitalopram Oxalate     REACTION: nervousness  . Milnacipran     REACTION: blurred vision, cough  . Penicillins     REACTION: rash  . Venlafaxine     REACTION: vomiting and flusing    Family History  Problem Relation Age of Onset  . Diabetes Maternal Grandmother   . Heart disease Maternal Grandmother   . Heart attack Maternal Grandmother   . Breast cancer Sister   . Diabetes Sister   . Ovarian cancer Sister   . GER disease Sister   . Anxiety disorder Sister   . Diabetes Daughter   . Anxiety disorder Daughter   . Kidney disease Mother     BP 110/70  Pulse 76  Temp(Src) 97.8 F (36.6 C) (Oral)  Ht 5\' 4"  (1.626 m)  Wt 99 lb (44.906 kg)  BMI 16.98 kg/m2  SpO2 94%  Review of Systems Denies weight change.     Objective:   Physical Exam VITAL SIGNS:  See vs page GENERAL: no distress NECK: There is no palpable thyroid enlargement.  No thyroid nodule is palpable.  No palpable lymphadenopathy at the anterior neck.     Lab Results  Component Value Date   TSH 0.31* 06/15/2013      Assessment & Plan:  Minimal hyperthyroidism: usually due to multinodular goiter.

## 2013-06-15 NOTE — Patient Instructions (Addendum)
Please come back for a follow-up appointment in 6 months.  blood tests are being requested for you today.  We'll contact you with results. most of the time, a "lumpy thyroid" will eventually become overactive again.

## 2013-06-16 LAB — T4, FREE: Free T4: 0.92 ng/dL (ref 0.60–1.60)

## 2013-06-16 LAB — TSH: TSH: 0.31 u[IU]/mL — ABNORMAL LOW (ref 0.35–5.50)

## 2013-07-21 DIAGNOSIS — K5900 Constipation, unspecified: Secondary | ICD-10-CM | POA: Diagnosis not present

## 2013-07-21 DIAGNOSIS — IMO0001 Reserved for inherently not codable concepts without codable children: Secondary | ICD-10-CM | POA: Diagnosis not present

## 2013-07-21 DIAGNOSIS — E119 Type 2 diabetes mellitus without complications: Secondary | ICD-10-CM | POA: Diagnosis not present

## 2013-07-21 DIAGNOSIS — R5383 Other fatigue: Secondary | ICD-10-CM | POA: Diagnosis not present

## 2013-07-21 DIAGNOSIS — J029 Acute pharyngitis, unspecified: Secondary | ICD-10-CM | POA: Diagnosis not present

## 2013-07-21 DIAGNOSIS — R5381 Other malaise: Secondary | ICD-10-CM | POA: Diagnosis not present

## 2013-07-21 DIAGNOSIS — F411 Generalized anxiety disorder: Secondary | ICD-10-CM | POA: Diagnosis not present

## 2013-07-21 DIAGNOSIS — R946 Abnormal results of thyroid function studies: Secondary | ICD-10-CM | POA: Diagnosis not present

## 2013-08-08 DIAGNOSIS — M5126 Other intervertebral disc displacement, lumbar region: Secondary | ICD-10-CM | POA: Diagnosis not present

## 2013-08-08 DIAGNOSIS — M999 Biomechanical lesion, unspecified: Secondary | ICD-10-CM | POA: Diagnosis not present

## 2013-08-10 DIAGNOSIS — R300 Dysuria: Secondary | ICD-10-CM | POA: Diagnosis not present

## 2013-08-11 DIAGNOSIS — R1909 Other intra-abdominal and pelvic swelling, mass and lump: Secondary | ICD-10-CM | POA: Diagnosis not present

## 2013-08-11 DIAGNOSIS — D539 Nutritional anemia, unspecified: Secondary | ICD-10-CM | POA: Diagnosis not present

## 2013-08-11 DIAGNOSIS — M542 Cervicalgia: Secondary | ICD-10-CM | POA: Diagnosis not present

## 2013-08-11 DIAGNOSIS — N390 Urinary tract infection, site not specified: Secondary | ICD-10-CM | POA: Diagnosis not present

## 2013-08-11 DIAGNOSIS — M5126 Other intervertebral disc displacement, lumbar region: Secondary | ICD-10-CM | POA: Diagnosis not present

## 2013-08-11 DIAGNOSIS — R443 Hallucinations, unspecified: Secondary | ICD-10-CM | POA: Diagnosis not present

## 2013-08-11 DIAGNOSIS — M999 Biomechanical lesion, unspecified: Secondary | ICD-10-CM | POA: Diagnosis not present

## 2013-08-16 DIAGNOSIS — M47817 Spondylosis without myelopathy or radiculopathy, lumbosacral region: Secondary | ICD-10-CM | POA: Diagnosis not present

## 2013-08-16 DIAGNOSIS — M542 Cervicalgia: Secondary | ICD-10-CM | POA: Diagnosis not present

## 2013-08-16 DIAGNOSIS — M169 Osteoarthritis of hip, unspecified: Secondary | ICD-10-CM | POA: Diagnosis not present

## 2013-08-16 DIAGNOSIS — M47812 Spondylosis without myelopathy or radiculopathy, cervical region: Secondary | ICD-10-CM | POA: Diagnosis not present

## 2013-08-16 DIAGNOSIS — M25559 Pain in unspecified hip: Secondary | ICD-10-CM | POA: Diagnosis not present

## 2013-08-16 DIAGNOSIS — M899 Disorder of bone, unspecified: Secondary | ICD-10-CM | POA: Diagnosis not present

## 2013-08-16 DIAGNOSIS — M4120 Other idiopathic scoliosis, site unspecified: Secondary | ICD-10-CM | POA: Diagnosis not present

## 2013-08-16 DIAGNOSIS — M1610 Unilateral primary osteoarthritis, unspecified hip: Secondary | ICD-10-CM | POA: Diagnosis not present

## 2013-08-16 DIAGNOSIS — M5030 Other cervical disc degeneration, unspecified cervical region: Secondary | ICD-10-CM | POA: Diagnosis not present

## 2013-08-16 DIAGNOSIS — M8448XA Pathological fracture, other site, initial encounter for fracture: Secondary | ICD-10-CM | POA: Diagnosis not present

## 2013-08-16 DIAGNOSIS — M549 Dorsalgia, unspecified: Secondary | ICD-10-CM | POA: Diagnosis not present

## 2013-08-18 DIAGNOSIS — M169 Osteoarthritis of hip, unspecified: Secondary | ICD-10-CM | POA: Diagnosis not present

## 2013-08-18 DIAGNOSIS — F0390 Unspecified dementia without behavioral disturbance: Secondary | ICD-10-CM | POA: Diagnosis not present

## 2013-08-18 DIAGNOSIS — M1610 Unilateral primary osteoarthritis, unspecified hip: Secondary | ICD-10-CM | POA: Diagnosis not present

## 2013-08-18 DIAGNOSIS — N390 Urinary tract infection, site not specified: Secondary | ICD-10-CM | POA: Diagnosis not present

## 2013-08-18 DIAGNOSIS — M549 Dorsalgia, unspecified: Secondary | ICD-10-CM | POA: Diagnosis not present

## 2013-08-22 DIAGNOSIS — N390 Urinary tract infection, site not specified: Secondary | ICD-10-CM | POA: Diagnosis not present

## 2013-08-22 DIAGNOSIS — M169 Osteoarthritis of hip, unspecified: Secondary | ICD-10-CM | POA: Diagnosis not present

## 2013-08-22 DIAGNOSIS — M549 Dorsalgia, unspecified: Secondary | ICD-10-CM | POA: Diagnosis not present

## 2013-08-22 DIAGNOSIS — F0390 Unspecified dementia without behavioral disturbance: Secondary | ICD-10-CM | POA: Diagnosis not present

## 2013-08-22 DIAGNOSIS — M1610 Unilateral primary osteoarthritis, unspecified hip: Secondary | ICD-10-CM | POA: Diagnosis not present

## 2013-08-25 DIAGNOSIS — N390 Urinary tract infection, site not specified: Secondary | ICD-10-CM | POA: Diagnosis not present

## 2013-08-25 DIAGNOSIS — M1610 Unilateral primary osteoarthritis, unspecified hip: Secondary | ICD-10-CM | POA: Diagnosis not present

## 2013-08-25 DIAGNOSIS — M169 Osteoarthritis of hip, unspecified: Secondary | ICD-10-CM | POA: Diagnosis not present

## 2013-08-25 DIAGNOSIS — M549 Dorsalgia, unspecified: Secondary | ICD-10-CM | POA: Diagnosis not present

## 2013-08-25 DIAGNOSIS — F0390 Unspecified dementia without behavioral disturbance: Secondary | ICD-10-CM | POA: Diagnosis not present

## 2013-08-29 DIAGNOSIS — N390 Urinary tract infection, site not specified: Secondary | ICD-10-CM | POA: Diagnosis not present

## 2013-08-31 DIAGNOSIS — M169 Osteoarthritis of hip, unspecified: Secondary | ICD-10-CM | POA: Diagnosis not present

## 2013-08-31 DIAGNOSIS — N390 Urinary tract infection, site not specified: Secondary | ICD-10-CM | POA: Diagnosis not present

## 2013-08-31 DIAGNOSIS — F0390 Unspecified dementia without behavioral disturbance: Secondary | ICD-10-CM | POA: Diagnosis not present

## 2013-08-31 DIAGNOSIS — M1610 Unilateral primary osteoarthritis, unspecified hip: Secondary | ICD-10-CM | POA: Diagnosis not present

## 2013-08-31 DIAGNOSIS — M549 Dorsalgia, unspecified: Secondary | ICD-10-CM | POA: Diagnosis not present

## 2013-09-15 DIAGNOSIS — M169 Osteoarthritis of hip, unspecified: Secondary | ICD-10-CM | POA: Diagnosis not present

## 2013-09-15 DIAGNOSIS — M1610 Unilateral primary osteoarthritis, unspecified hip: Secondary | ICD-10-CM | POA: Diagnosis not present

## 2013-09-15 DIAGNOSIS — F0390 Unspecified dementia without behavioral disturbance: Secondary | ICD-10-CM | POA: Diagnosis not present

## 2013-09-15 DIAGNOSIS — M549 Dorsalgia, unspecified: Secondary | ICD-10-CM | POA: Diagnosis not present

## 2013-09-15 DIAGNOSIS — N390 Urinary tract infection, site not specified: Secondary | ICD-10-CM | POA: Diagnosis not present

## 2013-12-07 DIAGNOSIS — Z23 Encounter for immunization: Secondary | ICD-10-CM | POA: Diagnosis not present

## 2013-12-14 ENCOUNTER — Ambulatory Visit: Payer: Medicare Other | Admitting: Endocrinology

## 2013-12-22 DIAGNOSIS — N390 Urinary tract infection, site not specified: Secondary | ICD-10-CM | POA: Diagnosis not present

## 2013-12-29 DIAGNOSIS — M791 Myalgia: Secondary | ICD-10-CM | POA: Diagnosis not present

## 2013-12-29 DIAGNOSIS — M9903 Segmental and somatic dysfunction of lumbar region: Secondary | ICD-10-CM | POA: Diagnosis not present

## 2013-12-29 DIAGNOSIS — M9905 Segmental and somatic dysfunction of pelvic region: Secondary | ICD-10-CM | POA: Diagnosis not present

## 2013-12-29 DIAGNOSIS — M9902 Segmental and somatic dysfunction of thoracic region: Secondary | ICD-10-CM | POA: Diagnosis not present

## 2013-12-29 DIAGNOSIS — M5416 Radiculopathy, lumbar region: Secondary | ICD-10-CM | POA: Diagnosis not present

## 2014-01-03 DIAGNOSIS — N390 Urinary tract infection, site not specified: Secondary | ICD-10-CM | POA: Diagnosis not present

## 2014-01-04 DIAGNOSIS — H26493 Other secondary cataract, bilateral: Secondary | ICD-10-CM | POA: Diagnosis not present

## 2014-01-04 DIAGNOSIS — H43812 Vitreous degeneration, left eye: Secondary | ICD-10-CM | POA: Diagnosis not present

## 2014-01-04 DIAGNOSIS — H401231 Low-tension glaucoma, bilateral, mild stage: Secondary | ICD-10-CM | POA: Diagnosis not present

## 2014-01-04 DIAGNOSIS — Z961 Presence of intraocular lens: Secondary | ICD-10-CM | POA: Diagnosis not present

## 2014-01-11 DIAGNOSIS — J180 Bronchopneumonia, unspecified organism: Secondary | ICD-10-CM | POA: Diagnosis not present

## 2014-01-16 ENCOUNTER — Inpatient Hospital Stay: Admit: 2014-01-16 | Payer: Self-pay | Admitting: Internal Medicine

## 2014-01-16 DIAGNOSIS — F419 Anxiety disorder, unspecified: Secondary | ICD-10-CM | POA: Diagnosis present

## 2014-01-16 DIAGNOSIS — I351 Nonrheumatic aortic (valve) insufficiency: Secondary | ICD-10-CM | POA: Diagnosis not present

## 2014-01-16 DIAGNOSIS — J9690 Respiratory failure, unspecified, unspecified whether with hypoxia or hypercapnia: Secondary | ICD-10-CM | POA: Diagnosis not present

## 2014-01-16 DIAGNOSIS — F329 Major depressive disorder, single episode, unspecified: Secondary | ICD-10-CM | POA: Diagnosis present

## 2014-01-16 DIAGNOSIS — Z885 Allergy status to narcotic agent status: Secondary | ICD-10-CM | POA: Diagnosis not present

## 2014-01-16 DIAGNOSIS — R05 Cough: Secondary | ICD-10-CM | POA: Diagnosis not present

## 2014-01-16 DIAGNOSIS — D649 Anemia, unspecified: Secondary | ICD-10-CM | POA: Diagnosis present

## 2014-01-16 DIAGNOSIS — J159 Unspecified bacterial pneumonia: Secondary | ICD-10-CM | POA: Diagnosis not present

## 2014-01-16 DIAGNOSIS — Z888 Allergy status to other drugs, medicaments and biological substances status: Secondary | ICD-10-CM | POA: Diagnosis not present

## 2014-01-16 DIAGNOSIS — I824Z1 Acute embolism and thrombosis of unspecified deep veins of right distal lower extremity: Secondary | ICD-10-CM | POA: Diagnosis not present

## 2014-01-16 DIAGNOSIS — G301 Alzheimer's disease with late onset: Secondary | ICD-10-CM | POA: Diagnosis not present

## 2014-01-16 DIAGNOSIS — I2609 Other pulmonary embolism with acute cor pulmonale: Secondary | ICD-10-CM | POA: Diagnosis not present

## 2014-01-16 DIAGNOSIS — Z880 Allergy status to penicillin: Secondary | ICD-10-CM | POA: Diagnosis not present

## 2014-01-16 DIAGNOSIS — F0280 Dementia in other diseases classified elsewhere without behavioral disturbance: Secondary | ICD-10-CM | POA: Diagnosis present

## 2014-01-16 DIAGNOSIS — I361 Nonrheumatic tricuspid (valve) insufficiency: Secondary | ICD-10-CM | POA: Diagnosis not present

## 2014-01-16 DIAGNOSIS — I2699 Other pulmonary embolism without acute cor pulmonale: Secondary | ICD-10-CM | POA: Diagnosis not present

## 2014-01-16 DIAGNOSIS — M810 Age-related osteoporosis without current pathological fracture: Secondary | ICD-10-CM | POA: Diagnosis present

## 2014-01-16 DIAGNOSIS — G4733 Obstructive sleep apnea (adult) (pediatric): Secondary | ICD-10-CM | POA: Diagnosis present

## 2014-01-16 DIAGNOSIS — R0602 Shortness of breath: Secondary | ICD-10-CM | POA: Diagnosis not present

## 2014-01-16 DIAGNOSIS — K219 Gastro-esophageal reflux disease without esophagitis: Secondary | ICD-10-CM | POA: Diagnosis not present

## 2014-01-16 DIAGNOSIS — M1990 Unspecified osteoarthritis, unspecified site: Secondary | ICD-10-CM | POA: Diagnosis present

## 2014-01-16 DIAGNOSIS — R918 Other nonspecific abnormal finding of lung field: Secondary | ICD-10-CM | POA: Diagnosis not present

## 2014-01-16 DIAGNOSIS — J90 Pleural effusion, not elsewhere classified: Secondary | ICD-10-CM | POA: Diagnosis not present

## 2014-01-16 DIAGNOSIS — I712 Thoracic aortic aneurysm, without rupture: Secondary | ICD-10-CM | POA: Diagnosis not present

## 2014-01-16 DIAGNOSIS — I82419 Acute embolism and thrombosis of unspecified femoral vein: Secondary | ICD-10-CM | POA: Diagnosis not present

## 2014-01-16 DIAGNOSIS — I340 Nonrheumatic mitral (valve) insufficiency: Secondary | ICD-10-CM | POA: Diagnosis not present

## 2014-01-16 DIAGNOSIS — I38 Endocarditis, valve unspecified: Secondary | ICD-10-CM | POA: Diagnosis present

## 2014-01-16 DIAGNOSIS — N3000 Acute cystitis without hematuria: Secondary | ICD-10-CM | POA: Diagnosis not present

## 2014-01-16 DIAGNOSIS — Z8673 Personal history of transient ischemic attack (TIA), and cerebral infarction without residual deficits: Secondary | ICD-10-CM | POA: Diagnosis not present

## 2014-01-16 DIAGNOSIS — J9601 Acute respiratory failure with hypoxia: Secondary | ICD-10-CM | POA: Diagnosis not present

## 2014-01-16 DIAGNOSIS — Z882 Allergy status to sulfonamides status: Secondary | ICD-10-CM | POA: Diagnosis not present

## 2014-01-16 DIAGNOSIS — J168 Pneumonia due to other specified infectious organisms: Secondary | ICD-10-CM | POA: Diagnosis not present

## 2014-01-23 DIAGNOSIS — Z7901 Long term (current) use of anticoagulants: Secondary | ICD-10-CM | POA: Diagnosis not present

## 2014-01-23 DIAGNOSIS — M810 Age-related osteoporosis without current pathological fracture: Secondary | ICD-10-CM | POA: Diagnosis not present

## 2014-01-23 DIAGNOSIS — I2699 Other pulmonary embolism without acute cor pulmonale: Secondary | ICD-10-CM | POA: Diagnosis not present

## 2014-01-23 DIAGNOSIS — D519 Vitamin B12 deficiency anemia, unspecified: Secondary | ICD-10-CM | POA: Diagnosis not present

## 2014-01-23 DIAGNOSIS — J159 Unspecified bacterial pneumonia: Secondary | ICD-10-CM | POA: Diagnosis not present

## 2014-01-24 DIAGNOSIS — I2699 Other pulmonary embolism without acute cor pulmonale: Secondary | ICD-10-CM | POA: Diagnosis not present

## 2014-01-24 DIAGNOSIS — D519 Vitamin B12 deficiency anemia, unspecified: Secondary | ICD-10-CM | POA: Diagnosis not present

## 2014-01-24 DIAGNOSIS — Z7901 Long term (current) use of anticoagulants: Secondary | ICD-10-CM | POA: Diagnosis not present

## 2014-01-24 DIAGNOSIS — M810 Age-related osteoporosis without current pathological fracture: Secondary | ICD-10-CM | POA: Diagnosis not present

## 2014-01-24 DIAGNOSIS — J159 Unspecified bacterial pneumonia: Secondary | ICD-10-CM | POA: Diagnosis not present

## 2014-01-25 DIAGNOSIS — D519 Vitamin B12 deficiency anemia, unspecified: Secondary | ICD-10-CM | POA: Diagnosis not present

## 2014-01-25 DIAGNOSIS — Z7901 Long term (current) use of anticoagulants: Secondary | ICD-10-CM | POA: Diagnosis not present

## 2014-01-25 DIAGNOSIS — J159 Unspecified bacterial pneumonia: Secondary | ICD-10-CM | POA: Diagnosis not present

## 2014-01-25 DIAGNOSIS — M810 Age-related osteoporosis without current pathological fracture: Secondary | ICD-10-CM | POA: Diagnosis not present

## 2014-01-25 DIAGNOSIS — I2699 Other pulmonary embolism without acute cor pulmonale: Secondary | ICD-10-CM | POA: Diagnosis not present

## 2014-01-26 DIAGNOSIS — G4700 Insomnia, unspecified: Secondary | ICD-10-CM | POA: Diagnosis not present

## 2014-01-26 DIAGNOSIS — I2699 Other pulmonary embolism without acute cor pulmonale: Secondary | ICD-10-CM | POA: Diagnosis not present

## 2014-01-27 DIAGNOSIS — J159 Unspecified bacterial pneumonia: Secondary | ICD-10-CM | POA: Diagnosis not present

## 2014-01-27 DIAGNOSIS — M810 Age-related osteoporosis without current pathological fracture: Secondary | ICD-10-CM | POA: Diagnosis not present

## 2014-01-27 DIAGNOSIS — Z7901 Long term (current) use of anticoagulants: Secondary | ICD-10-CM | POA: Diagnosis not present

## 2014-01-27 DIAGNOSIS — D519 Vitamin B12 deficiency anemia, unspecified: Secondary | ICD-10-CM | POA: Diagnosis not present

## 2014-01-27 DIAGNOSIS — I2699 Other pulmonary embolism without acute cor pulmonale: Secondary | ICD-10-CM | POA: Diagnosis not present

## 2014-01-30 DIAGNOSIS — D519 Vitamin B12 deficiency anemia, unspecified: Secondary | ICD-10-CM | POA: Diagnosis not present

## 2014-01-30 DIAGNOSIS — Z7901 Long term (current) use of anticoagulants: Secondary | ICD-10-CM | POA: Diagnosis not present

## 2014-01-30 DIAGNOSIS — I2699 Other pulmonary embolism without acute cor pulmonale: Secondary | ICD-10-CM | POA: Diagnosis not present

## 2014-01-30 DIAGNOSIS — J159 Unspecified bacterial pneumonia: Secondary | ICD-10-CM | POA: Diagnosis not present

## 2014-01-30 DIAGNOSIS — M810 Age-related osteoporosis without current pathological fracture: Secondary | ICD-10-CM | POA: Diagnosis not present

## 2014-01-31 DIAGNOSIS — J159 Unspecified bacterial pneumonia: Secondary | ICD-10-CM | POA: Diagnosis not present

## 2014-01-31 DIAGNOSIS — Z7901 Long term (current) use of anticoagulants: Secondary | ICD-10-CM | POA: Diagnosis not present

## 2014-01-31 DIAGNOSIS — I2699 Other pulmonary embolism without acute cor pulmonale: Secondary | ICD-10-CM | POA: Diagnosis not present

## 2014-01-31 DIAGNOSIS — D519 Vitamin B12 deficiency anemia, unspecified: Secondary | ICD-10-CM | POA: Diagnosis not present

## 2014-01-31 DIAGNOSIS — M810 Age-related osteoporosis without current pathological fracture: Secondary | ICD-10-CM | POA: Diagnosis not present

## 2014-02-01 DIAGNOSIS — M810 Age-related osteoporosis without current pathological fracture: Secondary | ICD-10-CM | POA: Diagnosis not present

## 2014-02-01 DIAGNOSIS — J159 Unspecified bacterial pneumonia: Secondary | ICD-10-CM | POA: Diagnosis not present

## 2014-02-01 DIAGNOSIS — D519 Vitamin B12 deficiency anemia, unspecified: Secondary | ICD-10-CM | POA: Diagnosis not present

## 2014-02-01 DIAGNOSIS — I2699 Other pulmonary embolism without acute cor pulmonale: Secondary | ICD-10-CM | POA: Diagnosis not present

## 2014-02-01 DIAGNOSIS — Z7901 Long term (current) use of anticoagulants: Secondary | ICD-10-CM | POA: Diagnosis not present

## 2014-02-02 DIAGNOSIS — D519 Vitamin B12 deficiency anemia, unspecified: Secondary | ICD-10-CM | POA: Diagnosis not present

## 2014-02-02 DIAGNOSIS — M810 Age-related osteoporosis without current pathological fracture: Secondary | ICD-10-CM | POA: Diagnosis not present

## 2014-02-02 DIAGNOSIS — J159 Unspecified bacterial pneumonia: Secondary | ICD-10-CM | POA: Diagnosis not present

## 2014-02-02 DIAGNOSIS — I2699 Other pulmonary embolism without acute cor pulmonale: Secondary | ICD-10-CM | POA: Diagnosis not present

## 2014-02-02 DIAGNOSIS — Z7901 Long term (current) use of anticoagulants: Secondary | ICD-10-CM | POA: Diagnosis not present

## 2014-02-03 DIAGNOSIS — R300 Dysuria: Secondary | ICD-10-CM | POA: Diagnosis not present

## 2014-02-03 DIAGNOSIS — Z7901 Long term (current) use of anticoagulants: Secondary | ICD-10-CM | POA: Diagnosis not present

## 2014-02-03 DIAGNOSIS — I2699 Other pulmonary embolism without acute cor pulmonale: Secondary | ICD-10-CM | POA: Diagnosis not present

## 2014-02-03 DIAGNOSIS — J159 Unspecified bacterial pneumonia: Secondary | ICD-10-CM | POA: Diagnosis not present

## 2014-02-03 DIAGNOSIS — D519 Vitamin B12 deficiency anemia, unspecified: Secondary | ICD-10-CM | POA: Diagnosis not present

## 2014-02-03 DIAGNOSIS — M810 Age-related osteoporosis without current pathological fracture: Secondary | ICD-10-CM | POA: Diagnosis not present

## 2014-02-06 DIAGNOSIS — I2699 Other pulmonary embolism without acute cor pulmonale: Secondary | ICD-10-CM | POA: Diagnosis not present

## 2014-02-06 DIAGNOSIS — J159 Unspecified bacterial pneumonia: Secondary | ICD-10-CM | POA: Diagnosis not present

## 2014-02-06 DIAGNOSIS — Z7901 Long term (current) use of anticoagulants: Secondary | ICD-10-CM | POA: Diagnosis not present

## 2014-02-06 DIAGNOSIS — M810 Age-related osteoporosis without current pathological fracture: Secondary | ICD-10-CM | POA: Diagnosis not present

## 2014-02-06 DIAGNOSIS — D519 Vitamin B12 deficiency anemia, unspecified: Secondary | ICD-10-CM | POA: Diagnosis not present

## 2014-02-07 DIAGNOSIS — J159 Unspecified bacterial pneumonia: Secondary | ICD-10-CM | POA: Diagnosis not present

## 2014-02-07 DIAGNOSIS — Z7901 Long term (current) use of anticoagulants: Secondary | ICD-10-CM | POA: Diagnosis not present

## 2014-02-07 DIAGNOSIS — M810 Age-related osteoporosis without current pathological fracture: Secondary | ICD-10-CM | POA: Diagnosis not present

## 2014-02-07 DIAGNOSIS — I2699 Other pulmonary embolism without acute cor pulmonale: Secondary | ICD-10-CM | POA: Diagnosis not present

## 2014-02-07 DIAGNOSIS — D519 Vitamin B12 deficiency anemia, unspecified: Secondary | ICD-10-CM | POA: Diagnosis not present

## 2014-02-09 DIAGNOSIS — J159 Unspecified bacterial pneumonia: Secondary | ICD-10-CM | POA: Diagnosis not present

## 2014-02-09 DIAGNOSIS — M810 Age-related osteoporosis without current pathological fracture: Secondary | ICD-10-CM | POA: Diagnosis not present

## 2014-02-09 DIAGNOSIS — I2699 Other pulmonary embolism without acute cor pulmonale: Secondary | ICD-10-CM | POA: Diagnosis not present

## 2014-02-09 DIAGNOSIS — Z7901 Long term (current) use of anticoagulants: Secondary | ICD-10-CM | POA: Diagnosis not present

## 2014-02-09 DIAGNOSIS — D519 Vitamin B12 deficiency anemia, unspecified: Secondary | ICD-10-CM | POA: Diagnosis not present

## 2014-02-10 DIAGNOSIS — D519 Vitamin B12 deficiency anemia, unspecified: Secondary | ICD-10-CM | POA: Diagnosis not present

## 2014-02-10 DIAGNOSIS — J159 Unspecified bacterial pneumonia: Secondary | ICD-10-CM | POA: Diagnosis not present

## 2014-02-10 DIAGNOSIS — Z7901 Long term (current) use of anticoagulants: Secondary | ICD-10-CM | POA: Diagnosis not present

## 2014-02-10 DIAGNOSIS — I2699 Other pulmonary embolism without acute cor pulmonale: Secondary | ICD-10-CM | POA: Diagnosis not present

## 2014-02-10 DIAGNOSIS — M810 Age-related osteoporosis without current pathological fracture: Secondary | ICD-10-CM | POA: Diagnosis not present

## 2014-02-13 DIAGNOSIS — Z7901 Long term (current) use of anticoagulants: Secondary | ICD-10-CM | POA: Diagnosis not present

## 2014-02-13 DIAGNOSIS — J159 Unspecified bacterial pneumonia: Secondary | ICD-10-CM | POA: Diagnosis not present

## 2014-02-13 DIAGNOSIS — I2699 Other pulmonary embolism without acute cor pulmonale: Secondary | ICD-10-CM | POA: Diagnosis not present

## 2014-02-13 DIAGNOSIS — D519 Vitamin B12 deficiency anemia, unspecified: Secondary | ICD-10-CM | POA: Diagnosis not present

## 2014-02-14 DIAGNOSIS — M810 Age-related osteoporosis without current pathological fracture: Secondary | ICD-10-CM | POA: Diagnosis not present

## 2014-02-14 DIAGNOSIS — J159 Unspecified bacterial pneumonia: Secondary | ICD-10-CM | POA: Diagnosis not present

## 2014-02-14 DIAGNOSIS — D519 Vitamin B12 deficiency anemia, unspecified: Secondary | ICD-10-CM | POA: Diagnosis not present

## 2014-02-14 DIAGNOSIS — I2699 Other pulmonary embolism without acute cor pulmonale: Secondary | ICD-10-CM | POA: Diagnosis not present

## 2014-02-14 DIAGNOSIS — Z7901 Long term (current) use of anticoagulants: Secondary | ICD-10-CM | POA: Diagnosis not present

## 2014-02-15 DIAGNOSIS — J159 Unspecified bacterial pneumonia: Secondary | ICD-10-CM | POA: Diagnosis not present

## 2014-02-15 DIAGNOSIS — D519 Vitamin B12 deficiency anemia, unspecified: Secondary | ICD-10-CM | POA: Diagnosis not present

## 2014-02-15 DIAGNOSIS — I2699 Other pulmonary embolism without acute cor pulmonale: Secondary | ICD-10-CM | POA: Diagnosis not present

## 2014-02-15 DIAGNOSIS — Z7901 Long term (current) use of anticoagulants: Secondary | ICD-10-CM | POA: Diagnosis not present

## 2014-02-15 DIAGNOSIS — M810 Age-related osteoporosis without current pathological fracture: Secondary | ICD-10-CM | POA: Diagnosis not present

## 2014-02-16 DIAGNOSIS — J159 Unspecified bacterial pneumonia: Secondary | ICD-10-CM | POA: Diagnosis not present

## 2014-02-16 DIAGNOSIS — M810 Age-related osteoporosis without current pathological fracture: Secondary | ICD-10-CM | POA: Diagnosis not present

## 2014-02-16 DIAGNOSIS — D519 Vitamin B12 deficiency anemia, unspecified: Secondary | ICD-10-CM | POA: Diagnosis not present

## 2014-02-16 DIAGNOSIS — I2699 Other pulmonary embolism without acute cor pulmonale: Secondary | ICD-10-CM | POA: Diagnosis not present

## 2014-02-16 DIAGNOSIS — Z7901 Long term (current) use of anticoagulants: Secondary | ICD-10-CM | POA: Diagnosis not present

## 2014-02-20 DIAGNOSIS — J159 Unspecified bacterial pneumonia: Secondary | ICD-10-CM | POA: Diagnosis not present

## 2014-02-20 DIAGNOSIS — M810 Age-related osteoporosis without current pathological fracture: Secondary | ICD-10-CM | POA: Diagnosis not present

## 2014-02-20 DIAGNOSIS — I2699 Other pulmonary embolism without acute cor pulmonale: Secondary | ICD-10-CM | POA: Diagnosis not present

## 2014-02-20 DIAGNOSIS — D519 Vitamin B12 deficiency anemia, unspecified: Secondary | ICD-10-CM | POA: Diagnosis not present

## 2014-02-20 DIAGNOSIS — Z7901 Long term (current) use of anticoagulants: Secondary | ICD-10-CM | POA: Diagnosis not present

## 2014-02-22 DIAGNOSIS — N390 Urinary tract infection, site not specified: Secondary | ICD-10-CM | POA: Diagnosis not present

## 2014-02-22 DIAGNOSIS — Z7901 Long term (current) use of anticoagulants: Secondary | ICD-10-CM | POA: Diagnosis not present

## 2014-02-22 DIAGNOSIS — M810 Age-related osteoporosis without current pathological fracture: Secondary | ICD-10-CM | POA: Diagnosis not present

## 2014-02-22 DIAGNOSIS — J159 Unspecified bacterial pneumonia: Secondary | ICD-10-CM | POA: Diagnosis not present

## 2014-02-22 DIAGNOSIS — D519 Vitamin B12 deficiency anemia, unspecified: Secondary | ICD-10-CM | POA: Diagnosis not present

## 2014-02-22 DIAGNOSIS — I2699 Other pulmonary embolism without acute cor pulmonale: Secondary | ICD-10-CM | POA: Diagnosis not present

## 2014-02-27 DIAGNOSIS — N390 Urinary tract infection, site not specified: Secondary | ICD-10-CM | POA: Diagnosis not present

## 2014-02-27 DIAGNOSIS — Z86711 Personal history of pulmonary embolism: Secondary | ICD-10-CM | POA: Diagnosis not present

## 2014-02-27 DIAGNOSIS — R0602 Shortness of breath: Secondary | ICD-10-CM | POA: Diagnosis not present

## 2014-02-27 DIAGNOSIS — R5383 Other fatigue: Secondary | ICD-10-CM | POA: Diagnosis not present

## 2014-02-28 DIAGNOSIS — Z961 Presence of intraocular lens: Secondary | ICD-10-CM | POA: Diagnosis not present

## 2014-02-28 DIAGNOSIS — Z7901 Long term (current) use of anticoagulants: Secondary | ICD-10-CM | POA: Diagnosis not present

## 2014-02-28 DIAGNOSIS — M810 Age-related osteoporosis without current pathological fracture: Secondary | ICD-10-CM | POA: Diagnosis not present

## 2014-02-28 DIAGNOSIS — H43812 Vitreous degeneration, left eye: Secondary | ICD-10-CM | POA: Diagnosis not present

## 2014-02-28 DIAGNOSIS — I2699 Other pulmonary embolism without acute cor pulmonale: Secondary | ICD-10-CM | POA: Diagnosis not present

## 2014-02-28 DIAGNOSIS — H26493 Other secondary cataract, bilateral: Secondary | ICD-10-CM | POA: Diagnosis not present

## 2014-02-28 DIAGNOSIS — D519 Vitamin B12 deficiency anemia, unspecified: Secondary | ICD-10-CM | POA: Diagnosis not present

## 2014-02-28 DIAGNOSIS — H401231 Low-tension glaucoma, bilateral, mild stage: Secondary | ICD-10-CM | POA: Diagnosis not present

## 2014-02-28 DIAGNOSIS — J159 Unspecified bacterial pneumonia: Secondary | ICD-10-CM | POA: Diagnosis not present

## 2014-03-01 DIAGNOSIS — M810 Age-related osteoporosis without current pathological fracture: Secondary | ICD-10-CM | POA: Diagnosis not present

## 2014-03-01 DIAGNOSIS — I2699 Other pulmonary embolism without acute cor pulmonale: Secondary | ICD-10-CM | POA: Diagnosis not present

## 2014-03-01 DIAGNOSIS — D519 Vitamin B12 deficiency anemia, unspecified: Secondary | ICD-10-CM | POA: Diagnosis not present

## 2014-03-01 DIAGNOSIS — H26493 Other secondary cataract, bilateral: Secondary | ICD-10-CM | POA: Diagnosis not present

## 2014-03-01 DIAGNOSIS — J159 Unspecified bacterial pneumonia: Secondary | ICD-10-CM | POA: Diagnosis not present

## 2014-03-01 DIAGNOSIS — Z961 Presence of intraocular lens: Secondary | ICD-10-CM | POA: Diagnosis not present

## 2014-03-01 DIAGNOSIS — H401231 Low-tension glaucoma, bilateral, mild stage: Secondary | ICD-10-CM | POA: Diagnosis not present

## 2014-03-01 DIAGNOSIS — Z7901 Long term (current) use of anticoagulants: Secondary | ICD-10-CM | POA: Diagnosis not present

## 2014-03-01 DIAGNOSIS — H43812 Vitreous degeneration, left eye: Secondary | ICD-10-CM | POA: Diagnosis not present

## 2014-03-03 DIAGNOSIS — I2699 Other pulmonary embolism without acute cor pulmonale: Secondary | ICD-10-CM | POA: Diagnosis not present

## 2014-03-03 DIAGNOSIS — M810 Age-related osteoporosis without current pathological fracture: Secondary | ICD-10-CM | POA: Diagnosis not present

## 2014-03-03 DIAGNOSIS — D519 Vitamin B12 deficiency anemia, unspecified: Secondary | ICD-10-CM | POA: Diagnosis not present

## 2014-03-03 DIAGNOSIS — Z7901 Long term (current) use of anticoagulants: Secondary | ICD-10-CM | POA: Diagnosis not present

## 2014-03-03 DIAGNOSIS — J159 Unspecified bacterial pneumonia: Secondary | ICD-10-CM | POA: Diagnosis not present

## 2014-03-07 DIAGNOSIS — I2699 Other pulmonary embolism without acute cor pulmonale: Secondary | ICD-10-CM | POA: Diagnosis not present

## 2014-03-07 DIAGNOSIS — J159 Unspecified bacterial pneumonia: Secondary | ICD-10-CM | POA: Diagnosis not present

## 2014-03-07 DIAGNOSIS — M810 Age-related osteoporosis without current pathological fracture: Secondary | ICD-10-CM | POA: Diagnosis not present

## 2014-03-07 DIAGNOSIS — D519 Vitamin B12 deficiency anemia, unspecified: Secondary | ICD-10-CM | POA: Diagnosis not present

## 2014-03-07 DIAGNOSIS — Z7901 Long term (current) use of anticoagulants: Secondary | ICD-10-CM | POA: Diagnosis not present

## 2014-03-08 DIAGNOSIS — D519 Vitamin B12 deficiency anemia, unspecified: Secondary | ICD-10-CM | POA: Diagnosis not present

## 2014-03-08 DIAGNOSIS — Z7901 Long term (current) use of anticoagulants: Secondary | ICD-10-CM | POA: Diagnosis not present

## 2014-03-08 DIAGNOSIS — I2699 Other pulmonary embolism without acute cor pulmonale: Secondary | ICD-10-CM | POA: Diagnosis not present

## 2014-03-08 DIAGNOSIS — M810 Age-related osteoporosis without current pathological fracture: Secondary | ICD-10-CM | POA: Diagnosis not present

## 2014-03-08 DIAGNOSIS — J159 Unspecified bacterial pneumonia: Secondary | ICD-10-CM | POA: Diagnosis not present

## 2014-03-09 DIAGNOSIS — I2699 Other pulmonary embolism without acute cor pulmonale: Secondary | ICD-10-CM | POA: Diagnosis not present

## 2014-03-09 DIAGNOSIS — Z7901 Long term (current) use of anticoagulants: Secondary | ICD-10-CM | POA: Diagnosis not present

## 2014-03-09 DIAGNOSIS — M810 Age-related osteoporosis without current pathological fracture: Secondary | ICD-10-CM | POA: Diagnosis not present

## 2014-03-09 DIAGNOSIS — J159 Unspecified bacterial pneumonia: Secondary | ICD-10-CM | POA: Diagnosis not present

## 2014-03-09 DIAGNOSIS — D519 Vitamin B12 deficiency anemia, unspecified: Secondary | ICD-10-CM | POA: Diagnosis not present

## 2014-03-13 DIAGNOSIS — J159 Unspecified bacterial pneumonia: Secondary | ICD-10-CM | POA: Diagnosis not present

## 2014-03-13 DIAGNOSIS — M810 Age-related osteoporosis without current pathological fracture: Secondary | ICD-10-CM | POA: Diagnosis not present

## 2014-03-13 DIAGNOSIS — I2699 Other pulmonary embolism without acute cor pulmonale: Secondary | ICD-10-CM | POA: Diagnosis not present

## 2014-03-13 DIAGNOSIS — D519 Vitamin B12 deficiency anemia, unspecified: Secondary | ICD-10-CM | POA: Diagnosis not present

## 2014-03-13 DIAGNOSIS — Z7901 Long term (current) use of anticoagulants: Secondary | ICD-10-CM | POA: Diagnosis not present

## 2014-03-16 DIAGNOSIS — I2699 Other pulmonary embolism without acute cor pulmonale: Secondary | ICD-10-CM | POA: Diagnosis not present

## 2014-03-16 DIAGNOSIS — J159 Unspecified bacterial pneumonia: Secondary | ICD-10-CM | POA: Diagnosis not present

## 2014-03-16 DIAGNOSIS — M810 Age-related osteoporosis without current pathological fracture: Secondary | ICD-10-CM | POA: Diagnosis not present

## 2014-03-16 DIAGNOSIS — D519 Vitamin B12 deficiency anemia, unspecified: Secondary | ICD-10-CM | POA: Diagnosis not present

## 2014-03-16 DIAGNOSIS — Z7901 Long term (current) use of anticoagulants: Secondary | ICD-10-CM | POA: Diagnosis not present

## 2014-03-20 ENCOUNTER — Institutional Professional Consult (permissible substitution): Payer: Medicare Other | Admitting: Pulmonary Disease

## 2014-03-21 DIAGNOSIS — Z4689 Encounter for fitting and adjustment of other specified devices: Secondary | ICD-10-CM | POA: Diagnosis not present

## 2014-03-21 DIAGNOSIS — N814 Uterovaginal prolapse, unspecified: Secondary | ICD-10-CM | POA: Diagnosis not present

## 2014-03-22 DIAGNOSIS — M810 Age-related osteoporosis without current pathological fracture: Secondary | ICD-10-CM | POA: Diagnosis not present

## 2014-03-22 DIAGNOSIS — J159 Unspecified bacterial pneumonia: Secondary | ICD-10-CM | POA: Diagnosis not present

## 2014-03-22 DIAGNOSIS — D519 Vitamin B12 deficiency anemia, unspecified: Secondary | ICD-10-CM | POA: Diagnosis not present

## 2014-03-22 DIAGNOSIS — I2699 Other pulmonary embolism without acute cor pulmonale: Secondary | ICD-10-CM | POA: Diagnosis not present

## 2014-03-22 DIAGNOSIS — Z7901 Long term (current) use of anticoagulants: Secondary | ICD-10-CM | POA: Diagnosis not present

## 2014-03-23 DIAGNOSIS — D519 Vitamin B12 deficiency anemia, unspecified: Secondary | ICD-10-CM | POA: Diagnosis not present

## 2014-03-23 DIAGNOSIS — J159 Unspecified bacterial pneumonia: Secondary | ICD-10-CM | POA: Diagnosis not present

## 2014-03-23 DIAGNOSIS — I2699 Other pulmonary embolism without acute cor pulmonale: Secondary | ICD-10-CM | POA: Diagnosis not present

## 2014-03-23 DIAGNOSIS — Z7901 Long term (current) use of anticoagulants: Secondary | ICD-10-CM | POA: Diagnosis not present

## 2014-03-23 DIAGNOSIS — M810 Age-related osteoporosis without current pathological fracture: Secondary | ICD-10-CM | POA: Diagnosis not present

## 2014-03-27 ENCOUNTER — Institutional Professional Consult (permissible substitution): Payer: Medicare Other | Admitting: Critical Care Medicine

## 2014-03-30 ENCOUNTER — Institutional Professional Consult (permissible substitution): Payer: Medicare Other | Admitting: Pulmonary Disease

## 2014-04-11 DIAGNOSIS — Z7901 Long term (current) use of anticoagulants: Secondary | ICD-10-CM | POA: Diagnosis not present

## 2014-05-01 DIAGNOSIS — R791 Abnormal coagulation profile: Secondary | ICD-10-CM | POA: Diagnosis not present

## 2014-05-05 DIAGNOSIS — N390 Urinary tract infection, site not specified: Secondary | ICD-10-CM | POA: Diagnosis not present

## 2014-05-09 DIAGNOSIS — J189 Pneumonia, unspecified organism: Secondary | ICD-10-CM | POA: Diagnosis not present

## 2014-05-29 DIAGNOSIS — R079 Chest pain, unspecified: Secondary | ICD-10-CM | POA: Diagnosis not present

## 2014-05-29 DIAGNOSIS — R7301 Impaired fasting glucose: Secondary | ICD-10-CM | POA: Diagnosis not present

## 2014-05-29 DIAGNOSIS — R0602 Shortness of breath: Secondary | ICD-10-CM | POA: Diagnosis not present

## 2014-05-29 DIAGNOSIS — R062 Wheezing: Secondary | ICD-10-CM | POA: Diagnosis not present

## 2014-05-29 DIAGNOSIS — J309 Allergic rhinitis, unspecified: Secondary | ICD-10-CM | POA: Diagnosis not present

## 2014-05-29 DIAGNOSIS — K5900 Constipation, unspecified: Secondary | ICD-10-CM | POA: Diagnosis not present

## 2014-05-29 DIAGNOSIS — F419 Anxiety disorder, unspecified: Secondary | ICD-10-CM | POA: Diagnosis not present

## 2014-05-29 DIAGNOSIS — R5383 Other fatigue: Secondary | ICD-10-CM | POA: Diagnosis not present

## 2014-05-29 DIAGNOSIS — G3184 Mild cognitive impairment, so stated: Secondary | ICD-10-CM | POA: Diagnosis not present

## 2014-05-29 DIAGNOSIS — Z7901 Long term (current) use of anticoagulants: Secondary | ICD-10-CM | POA: Diagnosis not present

## 2014-05-29 DIAGNOSIS — I712 Thoracic aortic aneurysm, without rupture: Secondary | ICD-10-CM | POA: Diagnosis not present

## 2014-05-29 DIAGNOSIS — R7989 Other specified abnormal findings of blood chemistry: Secondary | ICD-10-CM | POA: Diagnosis not present

## 2014-05-30 DIAGNOSIS — M791 Myalgia: Secondary | ICD-10-CM | POA: Diagnosis not present

## 2014-05-30 DIAGNOSIS — M9902 Segmental and somatic dysfunction of thoracic region: Secondary | ICD-10-CM | POA: Diagnosis not present

## 2014-05-30 DIAGNOSIS — M5416 Radiculopathy, lumbar region: Secondary | ICD-10-CM | POA: Diagnosis not present

## 2014-05-30 DIAGNOSIS — M9905 Segmental and somatic dysfunction of pelvic region: Secondary | ICD-10-CM | POA: Diagnosis not present

## 2014-05-30 DIAGNOSIS — M9903 Segmental and somatic dysfunction of lumbar region: Secondary | ICD-10-CM | POA: Diagnosis not present

## 2014-06-01 DIAGNOSIS — J189 Pneumonia, unspecified organism: Secondary | ICD-10-CM | POA: Diagnosis not present

## 2014-06-01 DIAGNOSIS — Z8611 Personal history of tuberculosis: Secondary | ICD-10-CM | POA: Diagnosis not present

## 2014-06-01 DIAGNOSIS — R269 Unspecified abnormalities of gait and mobility: Secondary | ICD-10-CM | POA: Diagnosis not present

## 2014-06-01 DIAGNOSIS — Z7901 Long term (current) use of anticoagulants: Secondary | ICD-10-CM | POA: Diagnosis not present

## 2014-06-02 DIAGNOSIS — J189 Pneumonia, unspecified organism: Secondary | ICD-10-CM | POA: Diagnosis not present

## 2014-06-02 DIAGNOSIS — R269 Unspecified abnormalities of gait and mobility: Secondary | ICD-10-CM | POA: Diagnosis not present

## 2014-06-02 DIAGNOSIS — Z7901 Long term (current) use of anticoagulants: Secondary | ICD-10-CM | POA: Diagnosis not present

## 2014-06-02 DIAGNOSIS — Z8611 Personal history of tuberculosis: Secondary | ICD-10-CM | POA: Diagnosis not present

## 2014-06-04 DIAGNOSIS — Z7901 Long term (current) use of anticoagulants: Secondary | ICD-10-CM | POA: Diagnosis not present

## 2014-06-04 DIAGNOSIS — Z8611 Personal history of tuberculosis: Secondary | ICD-10-CM | POA: Diagnosis not present

## 2014-06-04 DIAGNOSIS — R269 Unspecified abnormalities of gait and mobility: Secondary | ICD-10-CM | POA: Diagnosis not present

## 2014-06-04 DIAGNOSIS — J189 Pneumonia, unspecified organism: Secondary | ICD-10-CM | POA: Diagnosis not present

## 2014-06-05 DIAGNOSIS — R269 Unspecified abnormalities of gait and mobility: Secondary | ICD-10-CM | POA: Diagnosis not present

## 2014-06-05 DIAGNOSIS — Z7901 Long term (current) use of anticoagulants: Secondary | ICD-10-CM | POA: Diagnosis not present

## 2014-06-05 DIAGNOSIS — J189 Pneumonia, unspecified organism: Secondary | ICD-10-CM | POA: Diagnosis not present

## 2014-06-05 DIAGNOSIS — Z8611 Personal history of tuberculosis: Secondary | ICD-10-CM | POA: Diagnosis not present

## 2014-06-06 DIAGNOSIS — Z7901 Long term (current) use of anticoagulants: Secondary | ICD-10-CM | POA: Diagnosis not present

## 2014-06-06 DIAGNOSIS — J189 Pneumonia, unspecified organism: Secondary | ICD-10-CM | POA: Diagnosis not present

## 2014-06-06 DIAGNOSIS — Z8611 Personal history of tuberculosis: Secondary | ICD-10-CM | POA: Diagnosis not present

## 2014-06-06 DIAGNOSIS — R269 Unspecified abnormalities of gait and mobility: Secondary | ICD-10-CM | POA: Diagnosis not present

## 2014-06-08 DIAGNOSIS — R269 Unspecified abnormalities of gait and mobility: Secondary | ICD-10-CM | POA: Diagnosis not present

## 2014-06-08 DIAGNOSIS — Z8611 Personal history of tuberculosis: Secondary | ICD-10-CM | POA: Diagnosis not present

## 2014-06-08 DIAGNOSIS — Z7901 Long term (current) use of anticoagulants: Secondary | ICD-10-CM | POA: Diagnosis not present

## 2014-06-08 DIAGNOSIS — J189 Pneumonia, unspecified organism: Secondary | ICD-10-CM | POA: Diagnosis not present

## 2014-06-09 DIAGNOSIS — J189 Pneumonia, unspecified organism: Secondary | ICD-10-CM | POA: Diagnosis not present

## 2014-06-09 DIAGNOSIS — R269 Unspecified abnormalities of gait and mobility: Secondary | ICD-10-CM | POA: Diagnosis not present

## 2014-06-09 DIAGNOSIS — Z8611 Personal history of tuberculosis: Secondary | ICD-10-CM | POA: Diagnosis not present

## 2014-06-09 DIAGNOSIS — Z7901 Long term (current) use of anticoagulants: Secondary | ICD-10-CM | POA: Diagnosis not present

## 2014-06-12 DIAGNOSIS — Z7901 Long term (current) use of anticoagulants: Secondary | ICD-10-CM | POA: Diagnosis not present

## 2014-06-12 DIAGNOSIS — R269 Unspecified abnormalities of gait and mobility: Secondary | ICD-10-CM | POA: Diagnosis not present

## 2014-06-12 DIAGNOSIS — Z8611 Personal history of tuberculosis: Secondary | ICD-10-CM | POA: Diagnosis not present

## 2014-06-12 DIAGNOSIS — J189 Pneumonia, unspecified organism: Secondary | ICD-10-CM | POA: Diagnosis not present

## 2014-06-13 DIAGNOSIS — Z7901 Long term (current) use of anticoagulants: Secondary | ICD-10-CM | POA: Diagnosis not present

## 2014-06-13 DIAGNOSIS — J189 Pneumonia, unspecified organism: Secondary | ICD-10-CM | POA: Diagnosis not present

## 2014-06-13 DIAGNOSIS — R269 Unspecified abnormalities of gait and mobility: Secondary | ICD-10-CM | POA: Diagnosis not present

## 2014-06-13 DIAGNOSIS — Z8611 Personal history of tuberculosis: Secondary | ICD-10-CM | POA: Diagnosis not present

## 2014-06-14 DIAGNOSIS — I712 Thoracic aortic aneurysm, without rupture: Secondary | ICD-10-CM | POA: Diagnosis not present

## 2014-06-15 DIAGNOSIS — Z7901 Long term (current) use of anticoagulants: Secondary | ICD-10-CM | POA: Diagnosis not present

## 2014-06-15 DIAGNOSIS — J189 Pneumonia, unspecified organism: Secondary | ICD-10-CM | POA: Diagnosis not present

## 2014-06-15 DIAGNOSIS — Z8611 Personal history of tuberculosis: Secondary | ICD-10-CM | POA: Diagnosis not present

## 2014-06-15 DIAGNOSIS — R269 Unspecified abnormalities of gait and mobility: Secondary | ICD-10-CM | POA: Diagnosis not present

## 2014-06-19 DIAGNOSIS — J189 Pneumonia, unspecified organism: Secondary | ICD-10-CM | POA: Diagnosis not present

## 2014-06-19 DIAGNOSIS — Z8611 Personal history of tuberculosis: Secondary | ICD-10-CM | POA: Diagnosis not present

## 2014-06-19 DIAGNOSIS — Z7901 Long term (current) use of anticoagulants: Secondary | ICD-10-CM | POA: Diagnosis not present

## 2014-06-19 DIAGNOSIS — R269 Unspecified abnormalities of gait and mobility: Secondary | ICD-10-CM | POA: Diagnosis not present

## 2014-06-20 DIAGNOSIS — Z8611 Personal history of tuberculosis: Secondary | ICD-10-CM | POA: Diagnosis not present

## 2014-06-20 DIAGNOSIS — J189 Pneumonia, unspecified organism: Secondary | ICD-10-CM | POA: Diagnosis not present

## 2014-06-20 DIAGNOSIS — R269 Unspecified abnormalities of gait and mobility: Secondary | ICD-10-CM | POA: Diagnosis not present

## 2014-06-20 DIAGNOSIS — Z7901 Long term (current) use of anticoagulants: Secondary | ICD-10-CM | POA: Diagnosis not present

## 2014-06-22 DIAGNOSIS — Z8611 Personal history of tuberculosis: Secondary | ICD-10-CM | POA: Diagnosis not present

## 2014-06-22 DIAGNOSIS — R269 Unspecified abnormalities of gait and mobility: Secondary | ICD-10-CM | POA: Diagnosis not present

## 2014-06-22 DIAGNOSIS — J189 Pneumonia, unspecified organism: Secondary | ICD-10-CM | POA: Diagnosis not present

## 2014-06-22 DIAGNOSIS — Z7901 Long term (current) use of anticoagulants: Secondary | ICD-10-CM | POA: Diagnosis not present

## 2014-06-25 DIAGNOSIS — Z7901 Long term (current) use of anticoagulants: Secondary | ICD-10-CM | POA: Diagnosis not present

## 2014-06-25 DIAGNOSIS — Z8611 Personal history of tuberculosis: Secondary | ICD-10-CM | POA: Diagnosis not present

## 2014-06-25 DIAGNOSIS — J189 Pneumonia, unspecified organism: Secondary | ICD-10-CM | POA: Diagnosis not present

## 2014-06-25 DIAGNOSIS — R269 Unspecified abnormalities of gait and mobility: Secondary | ICD-10-CM | POA: Diagnosis not present

## 2014-06-26 ENCOUNTER — Other Ambulatory Visit: Payer: Self-pay | Admitting: *Deleted

## 2014-06-26 DIAGNOSIS — Z8611 Personal history of tuberculosis: Secondary | ICD-10-CM | POA: Diagnosis not present

## 2014-06-26 DIAGNOSIS — J189 Pneumonia, unspecified organism: Secondary | ICD-10-CM | POA: Diagnosis not present

## 2014-06-26 DIAGNOSIS — Z7901 Long term (current) use of anticoagulants: Secondary | ICD-10-CM | POA: Diagnosis not present

## 2014-06-26 DIAGNOSIS — R269 Unspecified abnormalities of gait and mobility: Secondary | ICD-10-CM | POA: Diagnosis not present

## 2014-06-26 DIAGNOSIS — I712 Thoracic aortic aneurysm, without rupture, unspecified: Secondary | ICD-10-CM

## 2014-06-28 DIAGNOSIS — R269 Unspecified abnormalities of gait and mobility: Secondary | ICD-10-CM | POA: Diagnosis not present

## 2014-06-28 DIAGNOSIS — Z8611 Personal history of tuberculosis: Secondary | ICD-10-CM | POA: Diagnosis not present

## 2014-06-28 DIAGNOSIS — J189 Pneumonia, unspecified organism: Secondary | ICD-10-CM | POA: Diagnosis not present

## 2014-06-28 DIAGNOSIS — Z7901 Long term (current) use of anticoagulants: Secondary | ICD-10-CM | POA: Diagnosis not present

## 2014-07-03 DIAGNOSIS — Z7901 Long term (current) use of anticoagulants: Secondary | ICD-10-CM | POA: Diagnosis not present

## 2014-07-03 DIAGNOSIS — J189 Pneumonia, unspecified organism: Secondary | ICD-10-CM | POA: Diagnosis not present

## 2014-07-03 DIAGNOSIS — R269 Unspecified abnormalities of gait and mobility: Secondary | ICD-10-CM | POA: Diagnosis not present

## 2014-07-03 DIAGNOSIS — Z8611 Personal history of tuberculosis: Secondary | ICD-10-CM | POA: Diagnosis not present

## 2014-07-04 ENCOUNTER — Ambulatory Visit (INDEPENDENT_AMBULATORY_CARE_PROVIDER_SITE_OTHER): Payer: Medicare Other | Admitting: Cardiothoracic Surgery

## 2014-07-04 ENCOUNTER — Ambulatory Visit (INDEPENDENT_AMBULATORY_CARE_PROVIDER_SITE_OTHER): Payer: Medicare Other | Admitting: Neurology

## 2014-07-04 ENCOUNTER — Encounter: Payer: Self-pay | Admitting: Cardiothoracic Surgery

## 2014-07-04 ENCOUNTER — Encounter: Payer: Self-pay | Admitting: Neurology

## 2014-07-04 VITALS — BP 124/77 | HR 98 | Resp 16 | Ht 64.0 in | Wt 107.0 lb

## 2014-07-04 VITALS — BP 141/74 | HR 80 | Ht 64.0 in | Wt 107.6 lb

## 2014-07-04 DIAGNOSIS — R413 Other amnesia: Secondary | ICD-10-CM

## 2014-07-04 DIAGNOSIS — R269 Unspecified abnormalities of gait and mobility: Secondary | ICD-10-CM | POA: Diagnosis not present

## 2014-07-04 DIAGNOSIS — I712 Thoracic aortic aneurysm, without rupture: Secondary | ICD-10-CM | POA: Diagnosis not present

## 2014-07-04 DIAGNOSIS — IMO0001 Reserved for inherently not codable concepts without codable children: Secondary | ICD-10-CM

## 2014-07-04 DIAGNOSIS — M791 Myalgia: Secondary | ICD-10-CM

## 2014-07-04 DIAGNOSIS — I7121 Aneurysm of the ascending aorta, without rupture: Secondary | ICD-10-CM

## 2014-07-04 DIAGNOSIS — E538 Deficiency of other specified B group vitamins: Secondary | ICD-10-CM

## 2014-07-04 DIAGNOSIS — M609 Myositis, unspecified: Secondary | ICD-10-CM

## 2014-07-04 HISTORY — DX: Other amnesia: R41.3

## 2014-07-04 HISTORY — DX: Unspecified abnormalities of gait and mobility: R26.9

## 2014-07-04 NOTE — Patient Instructions (Signed)

## 2014-07-04 NOTE — Progress Notes (Signed)
Reason for visit: Memory disorder  Referring physician: Dr. [name]  History of present illness:  [name] is an 81 year old left-handed white female with a history of a progressive memory disorder over the last 18 months. The patient has had significant problems with walking associated with her low back pain and degeneration. The patient uses a walker for ambulation, and she has been using this for several years. The patient is on amitriptyline for the back pain, and she takes 50 mg at night, she has been on this medication for quite a number of years. The patient is having more problems forgetting the names of people. She lives with her daughter, and she is not driving a car, and she has not driven for 5 years or more. The patient will misplace things about the house, she is having some difficulty managing the finances, and her daughter will be taking over this in the near future. The patient has a history of fibromyalgia, and she also has generalized fatigue. The patient has some tingling sensations in her feet. She is getting some physical therapy for her walking and for strengthening. The patient feels "weak all over". She denies any severe issues controlling the bowels or the bladder. She is sent to this office for an evaluation. She last had a CT scan of the brain done in 2014.  Past Medical History  Diagnosis Date  . Fibromyalgia   . Osteoporosis   . Mitral valve prolapse   . Aortic stenosis     mild  . Bladder infection, chronic   . Diabetes mellitus, type 2   . Depression   . GERD (gastroesophageal reflux disease)   . Insomnia   . Anxiety   . Stroke   . Arthritis   . Seasonal allergies   . COPD (chronic obstructive pulmonary disease)   . Thoracic aortic aneurysm   . Uterine prolapse   . Cerebral infarction, chronic 2012    left parietal  . Atrial fibrillation   . Memory difficulties 07/04/2014  . Gait disorder 07/04/2014     Past Surgical History  Procedure Laterality Date  . Ovarian cyst resection on left  1958  . Bladder tack  1959, [id number]  . Breast lump removed  1962, 1983  . Appendectomy    . Eye surgery    . Hernia repair    . Tubal ligation      Family History  Problem Relation Age of Onset  . Diabetes Maternal Grandmother   . Heart disease Maternal Grandmother   . Heart attack Maternal Grandmother   . Breast cancer Sister   . Diabetes Sister   . Ovarian cancer Sister   . GER disease Sister   . Anxiety disorder Sister   . Diabetes Daughter   . Anxiety disorder Daughter   . Kidney disease Mother   . Pneumonia Father   . Diabetes Brother     Social history:  reports that she has never smoked. She has never used smokeless tobacco. She reports that she does not drink alcohol or use illicit drugs.  Medications:  Prior to Admission medications   Medication Sig Start Date End Date Taking? Authorizing Provider  ALPRAZolam Duanne Moron) 0.5 MG tablet Take 0.25 mg by mouth 3 (three) times daily as needed.    Yes Historical Provider, MD  amitriptyline (ELAVIL) 25 MG tablet Take 50 mg by mouth at bedtime.    Yes Historical Provider, MD  BIOTIN  PO Take 1 tablet by mouth daily.   Yes Historical Provider, MD  Calcium Carb-Cholecalciferol (CALCIUM 500 +D) 500-400 MG-UNIT TABS Take by mouth 2 (two) times daily.   Yes Historical Provider, MD  cholecalciferol (VITAMIN D) 400 UNITS TABS tablet Take 400 Units by mouth daily.   Yes Historical Provider, MD  co-enzyme Q-10 30 MG capsule Take 30 mg by mouth 2 (two) times daily.    Yes Historical Provider, MD  cyanocobalamin (,VITAMIN B-12,) 1000 MCG/ML injection Inject 1,000 mcg into the muscle every 14 (fourteen) days.   Yes Historical Provider, MD  fish oil-omega-3 fatty acids 1000 MG capsule Take 2 g by mouth daily.   Yes Historical Provider, MD  Magnesium 100 MG TABS Take by mouth daily.   Yes Historical Provider, MD  Manganese 50 MG TABS Take by mouth daily.    Yes Historical Provider, MD  Multiple Vitamins-Minerals (MULTIVITAMIN PO) Take by mouth 2 (two) times daily.    Yes Historical Provider, MD  thiamine 100 MG tablet Take 100 mg by mouth daily.   Yes Historical Provider, MD  vitamin E 400 UNIT capsule Take 400 Units by mouth every other day.    Yes Historical Provider, MD  warfarin (COUMADIN) 3 MG tablet Take 3 mg by mouth daily.   Yes Historical Provider, MD      Allergies  Allergen Reactions  . Bisphosphonates     REACTION: states intol to all bisphos \\T \ Evista too...  . Cefuroxime Axetil     nausea  . Codeine     REACTION: nausea  . Escitalopram Oxalate     REACTION: nervousness  . Macrobid [Nitrofurantoin Monohyd Macro]     Abdominal pain  . Milnacipran     REACTION: blurred vision, cough  . Penicillins     REACTION: rash  . Seroquel [Quetiapine Fumarate]     Confusion and lost muscle tone  . Tramadol     Swelling in lips  . Venlafaxine     REACTION: vomiting and flusing  . Zithromax [Azithromycin]     Felt like coming out of skin  . Sulfa Antibiotics Rash    ROS:  Out of a complete 14 system review of symptoms, the patient complains only of the following symptoms, and all other reviewed systems are negative.  Fatigue Chest pain, palpitations of the heart, heart murmur, swelling in the legs Ringing in the ears, hearing loss, difficulty swallowing Skin rash, itching Cough, snoring Incontinence of the bowels, constipation Urination problems Anemia Feeling cold Joint pain, achy muscles Allergies, skin sensitivity, frequent infections Memory loss, headache, weakness, difficulty swallowing, dizziness, tremor Depression, anxiety, decreased energy, change in appetite, disinterest in activities Sleepiness  Blood pressure 141/74, pulse 80, height 5\' 4"  (1.626 m), weight 107 lb 9.6 oz (48.807 kg).  Physical Exam  General: The patient is alert and cooperative at the time of the examination.  Eyes: Pupils are equal,  round, and reactive to light. Discs are flat bilaterally.  Neck: The neck is supple, no carotid bruits are noted.  Respiratory: The respiratory examination is clear.  Cardiovascular: The cardiovascular examination reveals a regular rate and rhythm, a grade II/VI systolic ejection murmur is noted in the aortic area. A valvular click is noted.  Skin: Extremities are without significant edema.  Neurologic Exam  Mental status: The patient is alert and oriented x 3 at the time of the examination. The patient has apparent normal recent and remote memory, with an apparently normal attention span and concentration ability.  Mini-Mental Status Examination done today shows a total score 24/30.  Cranial nerves: Facial symmetry is present. There is good sensation of the face to pinprick and soft touch bilaterally. The strength of the facial muscles and the muscles to head turning and shoulder shrug are normal bilaterally. Speech is well enunciated, no aphasia or dysarthria is noted. Extraocular movements are full. Visual fields are full. The tongue is midline, and the patient has symmetric elevation of the soft palate. No obvious hearing deficits are noted.  Motor: The motor testing reveals 5 over 5 strength of all 4 extremities. Good symmetric motor tone is noted throughout.  Sensory: Sensory testing is intact to pinprick, soft touch, vibration sensation, and position sense on all 4 extremities. No evidence of extinction is noted.  Coordination: Cerebellar testing reveals good finger-nose-finger and heel-to-shin bilaterally.  Gait and station: Gait is unsteady, the patient uses a walker for ambulation. The patient is stooped. Romberg is unsteady, the patient does not fall. Tandem gait was not attempted.  Reflexes: Deep tendon reflexes are symmetric and normal bilaterally. Toes are downgoing bilaterally.   Assessment/Plan:  1. Gait disorder  2. Memory disorder  Patient has had some changes in  memory over time. She will be sent for blood work today, and have MRI evaluation of the brain. The patient is on amitriptyline which has anti-cholinergic side effect that may impair memory. I would recommend coming down to 25 mg at night, and eventually consider a full taper off of the medication. In the future, we may add other medications such as Aricept for the memory. The patient will follow-up in 6 months.  Jill Alexanders MD 07/04/2014 6:08 PM  [facility] [address] [place], [place] [address]  Phone [phone] Fax [phone]

## 2014-07-04 NOTE — Progress Notes (Signed)
[address]       [place] [address]             [id number]            Otie A [place] Medical Record #[id number] Date of Birth: [birth year]  Referring: Ernestene Kiel, MD Primary Care: Ernestene Kiel, MD  Chief Complaint:    Chief Complaint  Patient presents with  . Thoracic Aortic Aneurysm    f/u with CTA Chest 06/14/14...she had cancelled the CT that was scheduled in 2014 by [facility] after being seen initially in consult in 2013    History of Present Illness:    Patient is a elderly-appearing 81 -year-old female requires rolling walker to get around presents to the office with CT scan evidence of a dilated ascending aortic aneurysm.  The ascending  thoracic aorta measured approximately 4.3-4.5 cm this is slightly enlarged from 4.0 cm on a CT scan done in 2005. The patient has underlying chronic obstructive pulmonary disease.  Her chart notes mitral valve prolapse and aortic stenosis mild . there  The patient notes that she's had a echocardiogram one or more years ago but none recently.  She denies chest pain denies angina denies syncope denies PND. She is significantly limited by fibromyalgia and severe osteoporosis And old lumbar spine fractures. She has a history of an old stroke documented on CT scan.   Current Activity/ Functional Status: Patient is not independent with mobility/ambulation, transfers, ADL's, IADL's.   Past Medical History  Diagnosis Date  . Fibromyalgia   . Osteoporosis   . Mitral valve prolapse   . Aortic stenosis     mild  . Bladder infection, chronic   . Diabetes mellitus, type 2   . Depression   . GERD (gastroesophageal reflux disease)   . Insomnia   . Anxiety   . Stroke   . Arthritis   . Seasonal allergies   . COPD (chronic obstructive pulmonary disease)   . Thoracic aortic aneurysm   . Uterine prolapse   . Cerebral infarction, chronic 2012    left parietal  . Atrial fibrillation   . Memory  difficulties 07/04/2014  . Gait disorder 07/04/2014    Past Surgical History  Procedure Laterality Date  . Ovarian cyst resection on left  1958  . Bladder tack  1959, [id number]  . Breast lump removed  1962, 1983  . Appendectomy    . Eye surgery    . Hernia repair    . Tubal ligation      Family History  Problem Relation Age of Onset  . Diabetes Maternal Grandmother   . Heart disease Maternal Grandmother   . Heart attack Maternal Grandmother   . Breast cancer Sister   . Diabetes Sister   . Ovarian cancer Sister   . GER disease Sister   . Anxiety disorder Sister   . Diabetes Daughter   . Anxiety disorder Daughter   . Kidney disease Mother   . Pneumonia Father   . Diabetes Brother     History   Social History  . Marital Status: Married    Spouse Name: N/A    Number of Children: 3  . Years of Education: N/A   Occupational History  . retired Worked in Tourist information centre manager for many years   Social History Main Topics  . Smoking status: Never Smoker   . Smokeless tobacco: Never Used  . Alcohol Use: Not on file  .  Drug Use: Not on file  . Sexually Active: Not on file   Other Topics Concern  . Not on file   Social History Narrative   Lives with Husband    History  Smoking status  . Never Smoker   Smokeless tobacco  . Never Used    History  Alcohol Use No     Allergies  Allergen Reactions  . Bisphosphonates     REACTION: states intol to all bisphos \\T \ Evista too...  . Cefuroxime Axetil     nausea  . Codeine     REACTION: nausea  . Escitalopram Oxalate     REACTION: nervousness  . Macrobid [Nitrofurantoin Monohyd Macro]     Abdominal pain  . Milnacipran     REACTION: blurred vision, cough  . Penicillins     REACTION: rash  . Seroquel [Quetiapine Fumarate]     Confusion and lost muscle tone  . Tramadol     Swelling in lips  . Venlafaxine     REACTION: vomiting and flusing  . Zithromax [Azithromycin]     Felt like coming out of skin  . Sulfa Antibiotics Rash     Current Outpatient Prescriptions  Medication Sig Dispense Refill  . ALPRAZolam (XANAX) 0.5 MG tablet Take 0.25 mg by mouth 3 (three) times daily as needed.     Marland Kitchen amitriptyline (ELAVIL) 25 MG tablet Take 50 mg by mouth at bedtime.     Marland Kitchen BIOTIN PO Take 1 tablet by mouth daily.    . Calcium Carb-Cholecalciferol (CALCIUM 500 +D) 500-400 MG-UNIT TABS Take by mouth 2 (two) times daily.    . cholecalciferol (VITAMIN D) 400 UNITS TABS tablet Take 400 Units by mouth daily.    Marland Kitchen co-enzyme Q-10 30 MG capsule Take 30 mg by mouth 2 (two) times daily.     . cyanocobalamin (,VITAMIN B-12,) 1000 MCG/ML injection Inject 1,000 mcg into the muscle every 14 (fourteen) days.    . fish oil-omega-3 fatty acids 1000 MG capsule Take 2 g by mouth daily.    . Magnesium 100 MG TABS Take by mouth daily.    . Manganese 50 MG TABS Take by mouth daily.    . Multiple Vitamins-Minerals (MULTIVITAMIN PO) Take by mouth 2 (two) times daily.     Marland Kitchen thiamine 100 MG tablet Take 100 mg by mouth daily.    . vitamin E 400 UNIT capsule Take 400 Units by mouth every other day.     . warfarin (COUMADIN) 3 MG tablet Take 3 mg by mouth daily. Or as directed     No current facility-administered medications for this visit.       Review of Systems:     Cardiac Review of Systems: Y or N  Chest Pain [ n   ]  Resting SOB [ y  ] Exertional SOB  [ y ]  Orthopnea [ n ]   Pedal Edema [ n  ]    Palpitations [ n ] Syncope  [n  ]   Presyncope [n  ]  General Review of Systems: [Y] = yes [  ]=no Constitional: recent weight change [  ]; anorexia [  ]; fatigue [  ]; nausea [  ]; night sweats [  ]; fever [  ]; or chills [  ];  Dental: poor dentition[ n ]; Last Dentist visit:   Eye : blurred vision [  ]; diplopia [n   ]; vision changes [  ];  Amaurosis fugax[n  ]; Resp: cough [  ];  wheezing[  ];  hemoptysis[   ]; shortness of breath[  ]; paroxysmal nocturnal dyspnea[  ]; dyspnea on exertion[  ]; or orthopnea[  ];  GI:  gallstones[  ], vomiting[  ];  dysphagia[  ]; melena[  ];  hematochezia [  ]; heartburn[  ];   Hx of  Colonoscopy[  ]; GU: kidney stones [ n ]; hematuria[n  ];   dysuria [n  ];  nocturia[  ];  history of     obstruction [  ];             Skin: rash, swelling[  ];, hair loss[  ];  peripheral edema[  ];  or itching[  ]; Musculosketetal: myalgias[y  ];  joint swelling[ y ];  joint erythema[y  ];  joint pain[ y ];  back pain[  y];  Heme/Lymph: bruising[  ];  bleeding[  ];  anemia[  ];  Neuro: TIA[ n ];  headaches[  ];  stroke[y  ];  vertigo[ n ];  seizures[n  ];   paresthesias[n  ];  difficulty walking[ y ];  Psych:depression[y  ]; anxiety[  ];  Endocrine: diabetes[  ];  thyroid dysfunction[  ];  Immunizations: Flu [ ? ]; Pneumococcal[?  ];  Other:  Physical Exam: BP 124/77 mmHg  Pulse 98  Resp 16  Ht 5\' 4"  (1.626 m)  Wt 107 lb (48.535 kg)  BMI 18.36 kg/m2  SpO2 97% not on o2  General appearance: alert, cooperative, appears older than stated age, cachectic and no distress Neurologic: intact Heart: regularly irregular rhythm and systolic murmur: early systolic 2/6, crescendo at lower left sternal border Lungs: clear to auscultation bilaterally and normal percussion bilaterally Abdomen: soft, non-tender; bowel sounds normal; no masses,  no organomegaly Extremities: extremities normal, atraumatic, no cyanosis or edema and Homans sign is negative, no sign of DVT Patient has no carotid bruits,palpable DP and PT pulses   Diagnostic Studies & Laboratory data:     Recent Radiology Findings:  06/14/2014 CLINICAL DATA: Thoracic aortic aneurysm, without rupture.  EXAM: CT ANGIOGRAPHY CHEST WITH CONTRAST  TECHNIQUE: Multidetector CT imaging of the chest was performed using the standard protocol during bolus administration of intravenous contrast. Multiplanar CT image  reconstructions and MIPs were obtained to evaluate the vascular anatomy.  CONTRAST: 80 mL of Isovue 370 intravenously.  COMPARISON: CT scan of January 16, 2014.  FINDINGS: No pneumothorax or pleural effusion is noted. No acute pulmonary disease is noted. There is no evidence of pulmonary embolus. There is no evidence of thoracic aortic dissection. Ascending thoracic aorta measures 4.3 cm in maximum diameter consistent with aneurysmal dilatation. This is not significantly changed compared to prior exam. Great vessels are widely patent without significant stenosis. Calcified atheromatous disease is seen throughout the thoracic aorta appear distal portion of transverse aortic arch measures 3.5 cm in diameter. Descending thoracic aorta measures 2.5 cm in diameter. Visualized portion of upper abdomen appears normal. No significant osseous abnormality is noted in the chest. Nonobstructive left renal calculus is noted. Remaining portion of visualized upper abdomen is unremarkable.  Review of the MIP images confirms the above findings.  IMPRESSION: Stable 4.3 cm ascending thoracic aortic aneurysm is noted compared to prior exam. Recommend annual imaging followup by CTA or MRA. This recommendation  follows 2010 ACCF/AHA/AATS/ACR/ASA/SCA/SCAI/SIR/STS/SVM Guidelines for the Diagnosis and Management of Patients with Thoracic Aortic Disease. Circulation. 2010; 121: X096-E454   Electronically Signed By: Marijo Conception, M.D. On: 06/14/2014 16:57  I have independently reviewed the above radiology studies  and reviewed the findings with the patient.   CT scan 2005:  Clinical data: Generalized abdominal and pelvic pain; wt loss; nausea and vomiting; abnormal lt apical pulmonary opacity seen on recent chest radiograph TECHNIQUE: Multidetector CT imaging of the chest, abdomen, and pelvis was performed during administration of 150 cc Omnipaque 300 intravenous contrast. Oral contrast was also  administered.  CHEST CT WITH CONTRAST Comparison is made with recent chest radiograph on 10/07/03 from [facility].  No suspicious pulmonary nodules or masses are seen. No abnormal pulmonary opacity is seen in the left lung apex in the area questioned on the previous chest radiograph. Mild scarring is seen involving the right middle lobe.  There is no evidence of pleural or pericardial effusion. There is no evidence of hilar or mediastinal masses or adenopathy. Mild cardiomegaly is noted and there is an aneurysm of the ascending thoracic aorta measuring approximately 4.1 cm in greatest diameter. There is no evidence of aortic dissection.  IMPRESSION 1. No evidence of pulmonary masses or other active cardiopulmonary disease.  2. 4.1 cm ascending thoracic aortic aneurysm. ABDOMEN CT WITH CONTRAST A small, less than 1.0 cm low density lesion is seen in the central left hepatic lobe which is too small to characterize by CT and most likely represents a tiny cyst. The liver is otherwise unremarkable in appearance. The gallbladder, pancreas, spleen, adrenal glands, and kidneys are also normal in appearance.  There is no evidence of mass or adenopathy. There is no evidence of inflammatory process or abnormal fluid collections.  IMPRESSION Unremarkable abdomen CT. Probable small left hepatic lobe cyst incidentally noted.  PELVIS CT WITH CONTRAST There is no evidence of pelvic mass or adenopathy. There is no evidence of inflammatory process or abnormal fluid collections. There is no evidence of dilated bowel loops or hernia. IMPRESSION Negative pelvis CT.  CT scan 2013: Clinical Data: Prominent aortic arch on recent chest x-ray  CT ANGIOGRAPHY CHEST  Technique: Multidetector CT imaging of the chest using the standard protocol during bolus administration of intravenous contrast. Multiplanar reconstructed images including MIPs were obtained and reviewed to evaluate the vascular anatomy.  Contrast:  80 ml of Isovue 370  Comparison: 11/25/2011  Findings: The lungs are well-aerated bilaterally without evidence of focal infiltrate or sizable parenchymal nodule.  The thoracic aorta demonstrates diffuse calcifications and measures 4.5 x 98-4.2 cm in greatest AP and transverse dimensions in the ascending component. It is tortuous in the region of the aortic arch. The vessels of the neck and upper extremities are widely patent proximally. The aorta tapers in a normal fashion distally. No pulmonary emboli are identified.  The visualized upper abdomen is within normal limits. Degenerative changes of the thoracic spine are noted with increased kyphosis. No acute abnormality is noted.  IMPRESSION: Aneurysmal dilatation of the ascending aorta as described above. No dissection is identified.  No other focal abnormality is noted.   Original Report Authenticated By: Everlene Farrier, M.D.    Recent Lab Findings: Lab Results  Component Value Date   WBC 6.5 05/03/2009   HGB 12.6 05/03/2009   HCT 38.0 05/03/2009   PLT 247.0 05/03/2009   GLUCOSE 77 11/22/2012   CHOL 190 11/22/2012   TRIG 83.0 11/22/2012   HDL 54.90 11/22/2012  LDLDIRECT 141.9 08/10/2008   LDLCALC 119* 11/22/2012   ALT 27 05/03/2009   AST 25 05/03/2009   NA 139 11/22/2012   K 3.5 11/22/2012   CL 102 11/22/2012   CREATININE 0.6 11/22/2012   BUN 11 11/22/2012   CO2 37* 11/22/2012   TSH 0.31* 06/15/2013   Aortic Size Index=     4.5    /Body surface area is 1.48 meters squared. =3.04  < 2.75 cm/m2      4% risk per year 2.75 to 4.25          8% risk per year > 4.25 cm/m2    20% risk per year  cross sectional area of aorta cm2/height in meters > 10 consider  surgery size   Assessment / Plan:      Patient is a frail-appearing 81 year old female who has documented descending aorta 4.4 - 4.5 cm on a recent CT scan of the chest. CT scan of the chest in 2005 showed a 4.0 cm ascending aorta.  I discussed with the  patient and her daughter in detail the diagnosis of dilated ascending  aorta with less than half centimeter change over a 9 year. The patient is  frail I would not recommend elective repair of the ascending aorta at this time as over the years there's been little change. The patient declines having any surgical treatment for the ascending. She recognizes her poor medical condition and did not want major intervention.  Due to the decision by the patient I have not made her a follow up appointment or follow up CTA of the chest .   Grace Isaac MD  Beeper [phone] Office [phone] 07/04/2014 5:34 PM

## 2014-07-06 ENCOUNTER — Telehealth: Payer: Self-pay

## 2014-07-06 DIAGNOSIS — Z8611 Personal history of tuberculosis: Secondary | ICD-10-CM | POA: Diagnosis not present

## 2014-07-06 DIAGNOSIS — Z7901 Long term (current) use of anticoagulants: Secondary | ICD-10-CM | POA: Diagnosis not present

## 2014-07-06 DIAGNOSIS — R269 Unspecified abnormalities of gait and mobility: Secondary | ICD-10-CM | POA: Diagnosis not present

## 2014-07-06 DIAGNOSIS — J189 Pneumonia, unspecified organism: Secondary | ICD-10-CM | POA: Diagnosis not present

## 2014-07-06 LAB — RPR: RPR Ser Ql: NONREACTIVE

## 2014-07-06 LAB — COPPER, SERUM: Copper: 125 ug/dL (ref 72–166)

## 2014-07-06 LAB — VITAMIN B12

## 2014-07-06 NOTE — Telephone Encounter (Signed)
-----   Message from Kathrynn Ducking, MD sent at 07/06/2014  7:53 AM EDT -----  The blood work results are unremarkable. Please call the patient.  ----- Message -----    From: [facility] Lab Results In Interface    Sent: 07/05/2014   7:51 AM      To: Kathrynn Ducking, MD

## 2014-07-06 NOTE — Telephone Encounter (Signed)
Spoke to patient. Relayed results.

## 2014-07-10 DIAGNOSIS — R269 Unspecified abnormalities of gait and mobility: Secondary | ICD-10-CM | POA: Diagnosis not present

## 2014-07-10 DIAGNOSIS — Z8611 Personal history of tuberculosis: Secondary | ICD-10-CM | POA: Diagnosis not present

## 2014-07-10 DIAGNOSIS — Z7901 Long term (current) use of anticoagulants: Secondary | ICD-10-CM | POA: Diagnosis not present

## 2014-07-10 DIAGNOSIS — J189 Pneumonia, unspecified organism: Secondary | ICD-10-CM | POA: Diagnosis not present

## 2014-07-12 DIAGNOSIS — R269 Unspecified abnormalities of gait and mobility: Secondary | ICD-10-CM | POA: Diagnosis not present

## 2014-07-12 DIAGNOSIS — Z8611 Personal history of tuberculosis: Secondary | ICD-10-CM | POA: Diagnosis not present

## 2014-07-12 DIAGNOSIS — Z7901 Long term (current) use of anticoagulants: Secondary | ICD-10-CM | POA: Diagnosis not present

## 2014-07-12 DIAGNOSIS — J189 Pneumonia, unspecified organism: Secondary | ICD-10-CM | POA: Diagnosis not present

## 2014-07-17 DIAGNOSIS — J189 Pneumonia, unspecified organism: Secondary | ICD-10-CM | POA: Diagnosis not present

## 2014-07-17 DIAGNOSIS — Z7901 Long term (current) use of anticoagulants: Secondary | ICD-10-CM | POA: Diagnosis not present

## 2014-07-17 DIAGNOSIS — R269 Unspecified abnormalities of gait and mobility: Secondary | ICD-10-CM | POA: Diagnosis not present

## 2014-07-17 DIAGNOSIS — Z8611 Personal history of tuberculosis: Secondary | ICD-10-CM | POA: Diagnosis not present

## 2014-07-19 DIAGNOSIS — R269 Unspecified abnormalities of gait and mobility: Secondary | ICD-10-CM | POA: Diagnosis not present

## 2014-07-19 DIAGNOSIS — J189 Pneumonia, unspecified organism: Secondary | ICD-10-CM | POA: Diagnosis not present

## 2014-07-19 DIAGNOSIS — Z7901 Long term (current) use of anticoagulants: Secondary | ICD-10-CM | POA: Diagnosis not present

## 2014-07-19 DIAGNOSIS — Z8611 Personal history of tuberculosis: Secondary | ICD-10-CM | POA: Diagnosis not present

## 2014-07-20 DIAGNOSIS — R413 Other amnesia: Secondary | ICD-10-CM | POA: Diagnosis not present

## 2014-07-21 ENCOUNTER — Other Ambulatory Visit: Payer: Self-pay | Admitting: Neurology

## 2014-07-21 DIAGNOSIS — R413 Other amnesia: Secondary | ICD-10-CM

## 2014-07-21 DIAGNOSIS — IMO0001 Reserved for inherently not codable concepts without codable children: Secondary | ICD-10-CM

## 2014-07-24 ENCOUNTER — Telehealth: Payer: Self-pay | Admitting: Neurology

## 2014-07-24 DIAGNOSIS — Z7901 Long term (current) use of anticoagulants: Secondary | ICD-10-CM | POA: Diagnosis not present

## 2014-07-24 DIAGNOSIS — R269 Unspecified abnormalities of gait and mobility: Secondary | ICD-10-CM | POA: Diagnosis not present

## 2014-07-24 DIAGNOSIS — Z8611 Personal history of tuberculosis: Secondary | ICD-10-CM | POA: Diagnosis not present

## 2014-07-24 DIAGNOSIS — J189 Pneumonia, unspecified organism: Secondary | ICD-10-CM | POA: Diagnosis not present

## 2014-07-24 NOTE — Telephone Encounter (Signed)
  I called the patient. The MRI of the brain is stable with minimal chronic SVD.   MRI brain 07/23/14:  IMPRESSION: Abnormal MRI scan of the brain showing mild age-related changes of chronic microvascular ischemia, generalized cerebral atrophy and remote age left parietal infarct. Probably no significant change compared with MRI report from 09/19/10.

## 2014-07-27 DIAGNOSIS — R269 Unspecified abnormalities of gait and mobility: Secondary | ICD-10-CM | POA: Diagnosis not present

## 2014-07-27 DIAGNOSIS — J189 Pneumonia, unspecified organism: Secondary | ICD-10-CM | POA: Diagnosis not present

## 2014-07-27 DIAGNOSIS — Z7901 Long term (current) use of anticoagulants: Secondary | ICD-10-CM | POA: Diagnosis not present

## 2014-07-27 DIAGNOSIS — Z8611 Personal history of tuberculosis: Secondary | ICD-10-CM | POA: Diagnosis not present

## 2014-07-31 DIAGNOSIS — Z7901 Long term (current) use of anticoagulants: Secondary | ICD-10-CM | POA: Diagnosis not present

## 2014-08-10 DIAGNOSIS — N390 Urinary tract infection, site not specified: Secondary | ICD-10-CM | POA: Diagnosis not present

## 2014-08-10 DIAGNOSIS — R05 Cough: Secondary | ICD-10-CM | POA: Diagnosis not present

## 2014-08-10 DIAGNOSIS — Z7901 Long term (current) use of anticoagulants: Secondary | ICD-10-CM | POA: Diagnosis not present

## 2014-08-10 DIAGNOSIS — R5383 Other fatigue: Secondary | ICD-10-CM | POA: Diagnosis not present

## 2014-08-10 DIAGNOSIS — R1310 Dysphagia, unspecified: Secondary | ICD-10-CM | POA: Diagnosis not present

## 2014-08-10 DIAGNOSIS — G4700 Insomnia, unspecified: Secondary | ICD-10-CM | POA: Diagnosis not present

## 2014-08-10 DIAGNOSIS — R918 Other nonspecific abnormal finding of lung field: Secondary | ICD-10-CM | POA: Diagnosis not present

## 2014-08-10 DIAGNOSIS — G3184 Mild cognitive impairment, so stated: Secondary | ICD-10-CM | POA: Diagnosis not present

## 2014-08-10 DIAGNOSIS — R7989 Other specified abnormal findings of blood chemistry: Secondary | ICD-10-CM | POA: Diagnosis not present

## 2014-08-17 ENCOUNTER — Ambulatory Visit: Payer: Medicare Other | Admitting: Cardiology

## 2014-08-30 DIAGNOSIS — R791 Abnormal coagulation profile: Secondary | ICD-10-CM | POA: Diagnosis not present

## 2014-09-20 DIAGNOSIS — N390 Urinary tract infection, site not specified: Secondary | ICD-10-CM | POA: Diagnosis not present

## 2014-09-27 DIAGNOSIS — S299XXA Unspecified injury of thorax, initial encounter: Secondary | ICD-10-CM | POA: Diagnosis not present

## 2014-09-27 DIAGNOSIS — K219 Gastro-esophageal reflux disease without esophagitis: Secondary | ICD-10-CM | POA: Diagnosis not present

## 2014-09-27 DIAGNOSIS — M546 Pain in thoracic spine: Secondary | ICD-10-CM | POA: Diagnosis not present

## 2014-09-27 DIAGNOSIS — N390 Urinary tract infection, site not specified: Secondary | ICD-10-CM | POA: Diagnosis not present

## 2014-09-27 DIAGNOSIS — R52 Pain, unspecified: Secondary | ICD-10-CM | POA: Diagnosis not present

## 2014-09-27 DIAGNOSIS — J439 Emphysema, unspecified: Secondary | ICD-10-CM | POA: Diagnosis not present

## 2014-09-27 DIAGNOSIS — F0390 Unspecified dementia without behavioral disturbance: Secondary | ICD-10-CM | POA: Diagnosis not present

## 2014-09-27 DIAGNOSIS — M810 Age-related osteoporosis without current pathological fracture: Secondary | ICD-10-CM | POA: Diagnosis not present

## 2014-09-27 DIAGNOSIS — G8929 Other chronic pain: Secondary | ICD-10-CM | POA: Diagnosis not present

## 2014-09-27 DIAGNOSIS — S32020D Wedge compression fracture of second lumbar vertebra, subsequent encounter for fracture with routine healing: Secondary | ICD-10-CM | POA: Diagnosis not present

## 2014-09-27 DIAGNOSIS — Z79899 Other long term (current) drug therapy: Secondary | ICD-10-CM | POA: Diagnosis not present

## 2014-09-27 DIAGNOSIS — M545 Low back pain: Secondary | ICD-10-CM | POA: Diagnosis not present

## 2014-09-27 DIAGNOSIS — S3992XA Unspecified injury of lower back, initial encounter: Secondary | ICD-10-CM | POA: Diagnosis not present

## 2014-09-27 DIAGNOSIS — M549 Dorsalgia, unspecified: Secondary | ICD-10-CM | POA: Diagnosis not present

## 2014-09-27 DIAGNOSIS — Z7901 Long term (current) use of anticoagulants: Secondary | ICD-10-CM | POA: Diagnosis not present

## 2014-09-27 DIAGNOSIS — S32010D Wedge compression fracture of first lumbar vertebra, subsequent encounter for fracture with routine healing: Secondary | ICD-10-CM | POA: Diagnosis not present

## 2014-09-29 DIAGNOSIS — N390 Urinary tract infection, site not specified: Secondary | ICD-10-CM | POA: Diagnosis not present

## 2014-09-29 DIAGNOSIS — J449 Chronic obstructive pulmonary disease, unspecified: Secondary | ICD-10-CM | POA: Diagnosis not present

## 2014-09-29 DIAGNOSIS — I341 Nonrheumatic mitral (valve) prolapse: Secondary | ICD-10-CM | POA: Diagnosis not present

## 2014-09-29 DIAGNOSIS — M797 Fibromyalgia: Secondary | ICD-10-CM | POA: Diagnosis not present

## 2014-09-29 DIAGNOSIS — I350 Nonrheumatic aortic (valve) stenosis: Secondary | ICD-10-CM | POA: Diagnosis not present

## 2014-09-29 DIAGNOSIS — F339 Major depressive disorder, recurrent, unspecified: Secondary | ICD-10-CM | POA: Diagnosis not present

## 2014-10-03 DIAGNOSIS — M797 Fibromyalgia: Secondary | ICD-10-CM | POA: Diagnosis not present

## 2014-10-03 DIAGNOSIS — J449 Chronic obstructive pulmonary disease, unspecified: Secondary | ICD-10-CM | POA: Diagnosis not present

## 2014-10-03 DIAGNOSIS — N390 Urinary tract infection, site not specified: Secondary | ICD-10-CM | POA: Diagnosis not present

## 2014-10-03 DIAGNOSIS — I350 Nonrheumatic aortic (valve) stenosis: Secondary | ICD-10-CM | POA: Diagnosis not present

## 2014-10-03 DIAGNOSIS — I341 Nonrheumatic mitral (valve) prolapse: Secondary | ICD-10-CM | POA: Diagnosis not present

## 2014-10-03 DIAGNOSIS — F339 Major depressive disorder, recurrent, unspecified: Secondary | ICD-10-CM | POA: Diagnosis not present

## 2014-10-05 DIAGNOSIS — I341 Nonrheumatic mitral (valve) prolapse: Secondary | ICD-10-CM | POA: Diagnosis not present

## 2014-10-05 DIAGNOSIS — I350 Nonrheumatic aortic (valve) stenosis: Secondary | ICD-10-CM | POA: Diagnosis not present

## 2014-10-05 DIAGNOSIS — F339 Major depressive disorder, recurrent, unspecified: Secondary | ICD-10-CM | POA: Diagnosis not present

## 2014-10-05 DIAGNOSIS — N390 Urinary tract infection, site not specified: Secondary | ICD-10-CM | POA: Diagnosis not present

## 2014-10-05 DIAGNOSIS — J449 Chronic obstructive pulmonary disease, unspecified: Secondary | ICD-10-CM | POA: Diagnosis not present

## 2014-10-05 DIAGNOSIS — M797 Fibromyalgia: Secondary | ICD-10-CM | POA: Diagnosis not present

## 2014-10-06 DIAGNOSIS — M797 Fibromyalgia: Secondary | ICD-10-CM | POA: Diagnosis not present

## 2014-10-06 DIAGNOSIS — F339 Major depressive disorder, recurrent, unspecified: Secondary | ICD-10-CM | POA: Diagnosis not present

## 2014-10-06 DIAGNOSIS — J449 Chronic obstructive pulmonary disease, unspecified: Secondary | ICD-10-CM | POA: Diagnosis not present

## 2014-10-06 DIAGNOSIS — I350 Nonrheumatic aortic (valve) stenosis: Secondary | ICD-10-CM | POA: Diagnosis not present

## 2014-10-06 DIAGNOSIS — N390 Urinary tract infection, site not specified: Secondary | ICD-10-CM | POA: Diagnosis not present

## 2014-10-06 DIAGNOSIS — I341 Nonrheumatic mitral (valve) prolapse: Secondary | ICD-10-CM | POA: Diagnosis not present

## 2014-10-09 DIAGNOSIS — M546 Pain in thoracic spine: Secondary | ICD-10-CM | POA: Diagnosis not present

## 2014-10-09 DIAGNOSIS — M4806 Spinal stenosis, lumbar region: Secondary | ICD-10-CM | POA: Diagnosis not present

## 2014-10-09 DIAGNOSIS — M545 Low back pain: Secondary | ICD-10-CM | POA: Diagnosis not present

## 2014-10-09 DIAGNOSIS — I712 Thoracic aortic aneurysm, without rupture: Secondary | ICD-10-CM | POA: Diagnosis not present

## 2014-10-09 DIAGNOSIS — M40294 Other kyphosis, thoracic region: Secondary | ICD-10-CM | POA: Diagnosis not present

## 2014-10-09 DIAGNOSIS — M4856XA Collapsed vertebra, not elsewhere classified, lumbar region, initial encounter for fracture: Secondary | ICD-10-CM | POA: Diagnosis not present

## 2014-10-10 DIAGNOSIS — F339 Major depressive disorder, recurrent, unspecified: Secondary | ICD-10-CM | POA: Diagnosis not present

## 2014-10-10 DIAGNOSIS — I341 Nonrheumatic mitral (valve) prolapse: Secondary | ICD-10-CM | POA: Diagnosis not present

## 2014-10-10 DIAGNOSIS — J449 Chronic obstructive pulmonary disease, unspecified: Secondary | ICD-10-CM | POA: Diagnosis not present

## 2014-10-10 DIAGNOSIS — N390 Urinary tract infection, site not specified: Secondary | ICD-10-CM | POA: Diagnosis not present

## 2014-10-10 DIAGNOSIS — M797 Fibromyalgia: Secondary | ICD-10-CM | POA: Diagnosis not present

## 2014-10-10 DIAGNOSIS — I350 Nonrheumatic aortic (valve) stenosis: Secondary | ICD-10-CM | POA: Diagnosis not present

## 2014-10-11 DIAGNOSIS — M797 Fibromyalgia: Secondary | ICD-10-CM | POA: Diagnosis not present

## 2014-10-11 DIAGNOSIS — N390 Urinary tract infection, site not specified: Secondary | ICD-10-CM | POA: Diagnosis not present

## 2014-10-11 DIAGNOSIS — I341 Nonrheumatic mitral (valve) prolapse: Secondary | ICD-10-CM | POA: Diagnosis not present

## 2014-10-11 DIAGNOSIS — J449 Chronic obstructive pulmonary disease, unspecified: Secondary | ICD-10-CM | POA: Diagnosis not present

## 2014-10-11 DIAGNOSIS — I350 Nonrheumatic aortic (valve) stenosis: Secondary | ICD-10-CM | POA: Diagnosis not present

## 2014-10-11 DIAGNOSIS — F339 Major depressive disorder, recurrent, unspecified: Secondary | ICD-10-CM | POA: Diagnosis not present

## 2014-10-12 DIAGNOSIS — M797 Fibromyalgia: Secondary | ICD-10-CM | POA: Diagnosis not present

## 2014-10-12 DIAGNOSIS — N390 Urinary tract infection, site not specified: Secondary | ICD-10-CM | POA: Diagnosis not present

## 2014-10-12 DIAGNOSIS — I350 Nonrheumatic aortic (valve) stenosis: Secondary | ICD-10-CM | POA: Diagnosis not present

## 2014-10-12 DIAGNOSIS — J449 Chronic obstructive pulmonary disease, unspecified: Secondary | ICD-10-CM | POA: Diagnosis not present

## 2014-10-12 DIAGNOSIS — F339 Major depressive disorder, recurrent, unspecified: Secondary | ICD-10-CM | POA: Diagnosis not present

## 2014-10-12 DIAGNOSIS — I341 Nonrheumatic mitral (valve) prolapse: Secondary | ICD-10-CM | POA: Diagnosis not present

## 2014-10-13 DIAGNOSIS — F339 Major depressive disorder, recurrent, unspecified: Secondary | ICD-10-CM | POA: Diagnosis not present

## 2014-10-13 DIAGNOSIS — I341 Nonrheumatic mitral (valve) prolapse: Secondary | ICD-10-CM | POA: Diagnosis not present

## 2014-10-13 DIAGNOSIS — I350 Nonrheumatic aortic (valve) stenosis: Secondary | ICD-10-CM | POA: Diagnosis not present

## 2014-10-13 DIAGNOSIS — J449 Chronic obstructive pulmonary disease, unspecified: Secondary | ICD-10-CM | POA: Diagnosis not present

## 2014-10-13 DIAGNOSIS — M797 Fibromyalgia: Secondary | ICD-10-CM | POA: Diagnosis not present

## 2014-10-13 DIAGNOSIS — N390 Urinary tract infection, site not specified: Secondary | ICD-10-CM | POA: Diagnosis not present

## 2014-10-16 DIAGNOSIS — I350 Nonrheumatic aortic (valve) stenosis: Secondary | ICD-10-CM | POA: Diagnosis not present

## 2014-10-16 DIAGNOSIS — I341 Nonrheumatic mitral (valve) prolapse: Secondary | ICD-10-CM | POA: Diagnosis not present

## 2014-10-16 DIAGNOSIS — F339 Major depressive disorder, recurrent, unspecified: Secondary | ICD-10-CM | POA: Diagnosis not present

## 2014-10-16 DIAGNOSIS — M797 Fibromyalgia: Secondary | ICD-10-CM | POA: Diagnosis not present

## 2014-10-16 DIAGNOSIS — N390 Urinary tract infection, site not specified: Secondary | ICD-10-CM | POA: Diagnosis not present

## 2014-10-16 DIAGNOSIS — J449 Chronic obstructive pulmonary disease, unspecified: Secondary | ICD-10-CM | POA: Diagnosis not present

## 2014-10-17 DIAGNOSIS — I350 Nonrheumatic aortic (valve) stenosis: Secondary | ICD-10-CM | POA: Diagnosis not present

## 2014-10-17 DIAGNOSIS — M797 Fibromyalgia: Secondary | ICD-10-CM | POA: Diagnosis not present

## 2014-10-17 DIAGNOSIS — N390 Urinary tract infection, site not specified: Secondary | ICD-10-CM | POA: Diagnosis not present

## 2014-10-17 DIAGNOSIS — F339 Major depressive disorder, recurrent, unspecified: Secondary | ICD-10-CM | POA: Diagnosis not present

## 2014-10-17 DIAGNOSIS — J449 Chronic obstructive pulmonary disease, unspecified: Secondary | ICD-10-CM | POA: Diagnosis not present

## 2014-10-17 DIAGNOSIS — I341 Nonrheumatic mitral (valve) prolapse: Secondary | ICD-10-CM | POA: Diagnosis not present

## 2014-10-18 DIAGNOSIS — S32010A Wedge compression fracture of first lumbar vertebra, initial encounter for closed fracture: Secondary | ICD-10-CM | POA: Diagnosis not present

## 2014-10-18 DIAGNOSIS — M549 Dorsalgia, unspecified: Secondary | ICD-10-CM | POA: Diagnosis not present

## 2014-10-19 DIAGNOSIS — M797 Fibromyalgia: Secondary | ICD-10-CM | POA: Diagnosis not present

## 2014-10-19 DIAGNOSIS — N390 Urinary tract infection, site not specified: Secondary | ICD-10-CM | POA: Diagnosis not present

## 2014-10-19 DIAGNOSIS — F339 Major depressive disorder, recurrent, unspecified: Secondary | ICD-10-CM | POA: Diagnosis not present

## 2014-10-19 DIAGNOSIS — J449 Chronic obstructive pulmonary disease, unspecified: Secondary | ICD-10-CM | POA: Diagnosis not present

## 2014-10-19 DIAGNOSIS — I341 Nonrheumatic mitral (valve) prolapse: Secondary | ICD-10-CM | POA: Diagnosis not present

## 2014-10-19 DIAGNOSIS — I350 Nonrheumatic aortic (valve) stenosis: Secondary | ICD-10-CM | POA: Diagnosis not present

## 2014-10-24 DIAGNOSIS — N390 Urinary tract infection, site not specified: Secondary | ICD-10-CM | POA: Diagnosis not present

## 2014-10-24 DIAGNOSIS — J449 Chronic obstructive pulmonary disease, unspecified: Secondary | ICD-10-CM | POA: Diagnosis not present

## 2014-10-24 DIAGNOSIS — I350 Nonrheumatic aortic (valve) stenosis: Secondary | ICD-10-CM | POA: Diagnosis not present

## 2014-10-24 DIAGNOSIS — F339 Major depressive disorder, recurrent, unspecified: Secondary | ICD-10-CM | POA: Diagnosis not present

## 2014-10-24 DIAGNOSIS — M797 Fibromyalgia: Secondary | ICD-10-CM | POA: Diagnosis not present

## 2014-10-24 DIAGNOSIS — I341 Nonrheumatic mitral (valve) prolapse: Secondary | ICD-10-CM | POA: Diagnosis not present

## 2014-10-26 DIAGNOSIS — I350 Nonrheumatic aortic (valve) stenosis: Secondary | ICD-10-CM | POA: Diagnosis not present

## 2014-10-26 DIAGNOSIS — F339 Major depressive disorder, recurrent, unspecified: Secondary | ICD-10-CM | POA: Diagnosis not present

## 2014-10-26 DIAGNOSIS — N390 Urinary tract infection, site not specified: Secondary | ICD-10-CM | POA: Diagnosis not present

## 2014-10-26 DIAGNOSIS — I341 Nonrheumatic mitral (valve) prolapse: Secondary | ICD-10-CM | POA: Diagnosis not present

## 2014-10-26 DIAGNOSIS — J449 Chronic obstructive pulmonary disease, unspecified: Secondary | ICD-10-CM | POA: Diagnosis not present

## 2014-10-26 DIAGNOSIS — M797 Fibromyalgia: Secondary | ICD-10-CM | POA: Diagnosis not present

## 2014-10-27 DIAGNOSIS — I350 Nonrheumatic aortic (valve) stenosis: Secondary | ICD-10-CM | POA: Diagnosis not present

## 2014-10-27 DIAGNOSIS — J449 Chronic obstructive pulmonary disease, unspecified: Secondary | ICD-10-CM | POA: Diagnosis not present

## 2014-10-27 DIAGNOSIS — M797 Fibromyalgia: Secondary | ICD-10-CM | POA: Diagnosis not present

## 2014-10-27 DIAGNOSIS — N390 Urinary tract infection, site not specified: Secondary | ICD-10-CM | POA: Diagnosis not present

## 2014-10-27 DIAGNOSIS — F339 Major depressive disorder, recurrent, unspecified: Secondary | ICD-10-CM | POA: Diagnosis not present

## 2014-10-27 DIAGNOSIS — I341 Nonrheumatic mitral (valve) prolapse: Secondary | ICD-10-CM | POA: Diagnosis not present

## 2014-10-30 DIAGNOSIS — I350 Nonrheumatic aortic (valve) stenosis: Secondary | ICD-10-CM | POA: Diagnosis not present

## 2014-10-30 DIAGNOSIS — I341 Nonrheumatic mitral (valve) prolapse: Secondary | ICD-10-CM | POA: Diagnosis not present

## 2014-10-30 DIAGNOSIS — N390 Urinary tract infection, site not specified: Secondary | ICD-10-CM | POA: Diagnosis not present

## 2014-10-30 DIAGNOSIS — J449 Chronic obstructive pulmonary disease, unspecified: Secondary | ICD-10-CM | POA: Diagnosis not present

## 2014-10-30 DIAGNOSIS — M797 Fibromyalgia: Secondary | ICD-10-CM | POA: Diagnosis not present

## 2014-10-31 DIAGNOSIS — J449 Chronic obstructive pulmonary disease, unspecified: Secondary | ICD-10-CM | POA: Diagnosis not present

## 2014-10-31 DIAGNOSIS — M797 Fibromyalgia: Secondary | ICD-10-CM | POA: Diagnosis not present

## 2014-10-31 DIAGNOSIS — I341 Nonrheumatic mitral (valve) prolapse: Secondary | ICD-10-CM | POA: Diagnosis not present

## 2014-10-31 DIAGNOSIS — I350 Nonrheumatic aortic (valve) stenosis: Secondary | ICD-10-CM | POA: Diagnosis not present

## 2014-10-31 DIAGNOSIS — N390 Urinary tract infection, site not specified: Secondary | ICD-10-CM | POA: Diagnosis not present

## 2014-10-31 DIAGNOSIS — F339 Major depressive disorder, recurrent, unspecified: Secondary | ICD-10-CM | POA: Diagnosis not present

## 2014-11-01 DIAGNOSIS — N390 Urinary tract infection, site not specified: Secondary | ICD-10-CM | POA: Diagnosis not present

## 2014-11-01 DIAGNOSIS — I341 Nonrheumatic mitral (valve) prolapse: Secondary | ICD-10-CM | POA: Diagnosis not present

## 2014-11-01 DIAGNOSIS — M797 Fibromyalgia: Secondary | ICD-10-CM | POA: Diagnosis not present

## 2014-11-01 DIAGNOSIS — I350 Nonrheumatic aortic (valve) stenosis: Secondary | ICD-10-CM | POA: Diagnosis not present

## 2014-11-01 DIAGNOSIS — F339 Major depressive disorder, recurrent, unspecified: Secondary | ICD-10-CM | POA: Diagnosis not present

## 2014-11-01 DIAGNOSIS — J449 Chronic obstructive pulmonary disease, unspecified: Secondary | ICD-10-CM | POA: Diagnosis not present

## 2014-11-07 DIAGNOSIS — I350 Nonrheumatic aortic (valve) stenosis: Secondary | ICD-10-CM | POA: Diagnosis not present

## 2014-11-07 DIAGNOSIS — I341 Nonrheumatic mitral (valve) prolapse: Secondary | ICD-10-CM | POA: Diagnosis not present

## 2014-11-07 DIAGNOSIS — N390 Urinary tract infection, site not specified: Secondary | ICD-10-CM | POA: Diagnosis not present

## 2014-11-07 DIAGNOSIS — J449 Chronic obstructive pulmonary disease, unspecified: Secondary | ICD-10-CM | POA: Diagnosis not present

## 2014-11-07 DIAGNOSIS — F339 Major depressive disorder, recurrent, unspecified: Secondary | ICD-10-CM | POA: Diagnosis not present

## 2014-11-07 DIAGNOSIS — M797 Fibromyalgia: Secondary | ICD-10-CM | POA: Diagnosis not present

## 2014-11-10 DIAGNOSIS — J449 Chronic obstructive pulmonary disease, unspecified: Secondary | ICD-10-CM | POA: Diagnosis not present

## 2014-11-10 DIAGNOSIS — I341 Nonrheumatic mitral (valve) prolapse: Secondary | ICD-10-CM | POA: Diagnosis not present

## 2014-11-10 DIAGNOSIS — M797 Fibromyalgia: Secondary | ICD-10-CM | POA: Diagnosis not present

## 2014-11-10 DIAGNOSIS — F339 Major depressive disorder, recurrent, unspecified: Secondary | ICD-10-CM | POA: Diagnosis not present

## 2014-11-10 DIAGNOSIS — I350 Nonrheumatic aortic (valve) stenosis: Secondary | ICD-10-CM | POA: Diagnosis not present

## 2014-11-10 DIAGNOSIS — N390 Urinary tract infection, site not specified: Secondary | ICD-10-CM | POA: Diagnosis not present

## 2014-11-14 DIAGNOSIS — I350 Nonrheumatic aortic (valve) stenosis: Secondary | ICD-10-CM | POA: Diagnosis not present

## 2014-11-14 DIAGNOSIS — I341 Nonrheumatic mitral (valve) prolapse: Secondary | ICD-10-CM | POA: Diagnosis not present

## 2014-11-14 DIAGNOSIS — F339 Major depressive disorder, recurrent, unspecified: Secondary | ICD-10-CM | POA: Diagnosis not present

## 2014-11-14 DIAGNOSIS — N390 Urinary tract infection, site not specified: Secondary | ICD-10-CM | POA: Diagnosis not present

## 2014-11-14 DIAGNOSIS — J449 Chronic obstructive pulmonary disease, unspecified: Secondary | ICD-10-CM | POA: Diagnosis not present

## 2014-11-14 DIAGNOSIS — M797 Fibromyalgia: Secondary | ICD-10-CM | POA: Diagnosis not present

## 2014-11-16 DIAGNOSIS — F339 Major depressive disorder, recurrent, unspecified: Secondary | ICD-10-CM | POA: Diagnosis not present

## 2014-11-16 DIAGNOSIS — I350 Nonrheumatic aortic (valve) stenosis: Secondary | ICD-10-CM | POA: Diagnosis not present

## 2014-11-16 DIAGNOSIS — I341 Nonrheumatic mitral (valve) prolapse: Secondary | ICD-10-CM | POA: Diagnosis not present

## 2014-11-16 DIAGNOSIS — N390 Urinary tract infection, site not specified: Secondary | ICD-10-CM | POA: Diagnosis not present

## 2014-11-16 DIAGNOSIS — J449 Chronic obstructive pulmonary disease, unspecified: Secondary | ICD-10-CM | POA: Diagnosis not present

## 2014-11-16 DIAGNOSIS — M797 Fibromyalgia: Secondary | ICD-10-CM | POA: Diagnosis not present

## 2014-11-21 DIAGNOSIS — J449 Chronic obstructive pulmonary disease, unspecified: Secondary | ICD-10-CM | POA: Diagnosis not present

## 2014-11-21 DIAGNOSIS — N390 Urinary tract infection, site not specified: Secondary | ICD-10-CM | POA: Diagnosis not present

## 2014-11-21 DIAGNOSIS — F339 Major depressive disorder, recurrent, unspecified: Secondary | ICD-10-CM | POA: Diagnosis not present

## 2014-11-21 DIAGNOSIS — I350 Nonrheumatic aortic (valve) stenosis: Secondary | ICD-10-CM | POA: Diagnosis not present

## 2014-11-21 DIAGNOSIS — I341 Nonrheumatic mitral (valve) prolapse: Secondary | ICD-10-CM | POA: Diagnosis not present

## 2014-11-21 DIAGNOSIS — M797 Fibromyalgia: Secondary | ICD-10-CM | POA: Diagnosis not present

## 2014-11-23 DIAGNOSIS — F339 Major depressive disorder, recurrent, unspecified: Secondary | ICD-10-CM | POA: Diagnosis not present

## 2014-11-23 DIAGNOSIS — N390 Urinary tract infection, site not specified: Secondary | ICD-10-CM | POA: Diagnosis not present

## 2014-11-23 DIAGNOSIS — M797 Fibromyalgia: Secondary | ICD-10-CM | POA: Diagnosis not present

## 2014-11-23 DIAGNOSIS — I350 Nonrheumatic aortic (valve) stenosis: Secondary | ICD-10-CM | POA: Diagnosis not present

## 2014-11-23 DIAGNOSIS — I341 Nonrheumatic mitral (valve) prolapse: Secondary | ICD-10-CM | POA: Diagnosis not present

## 2014-11-23 DIAGNOSIS — J449 Chronic obstructive pulmonary disease, unspecified: Secondary | ICD-10-CM | POA: Diagnosis not present

## 2014-11-24 ENCOUNTER — Encounter: Payer: Self-pay | Admitting: Internal Medicine

## 2014-11-27 DIAGNOSIS — N390 Urinary tract infection, site not specified: Secondary | ICD-10-CM | POA: Diagnosis not present

## 2014-11-27 DIAGNOSIS — I350 Nonrheumatic aortic (valve) stenosis: Secondary | ICD-10-CM | POA: Diagnosis not present

## 2014-11-27 DIAGNOSIS — F339 Major depressive disorder, recurrent, unspecified: Secondary | ICD-10-CM | POA: Diagnosis not present

## 2014-11-27 DIAGNOSIS — I341 Nonrheumatic mitral (valve) prolapse: Secondary | ICD-10-CM | POA: Diagnosis not present

## 2014-11-27 DIAGNOSIS — J449 Chronic obstructive pulmonary disease, unspecified: Secondary | ICD-10-CM | POA: Diagnosis not present

## 2014-11-27 DIAGNOSIS — M797 Fibromyalgia: Secondary | ICD-10-CM | POA: Diagnosis not present

## 2015-01-01 DIAGNOSIS — Z7901 Long term (current) use of anticoagulants: Secondary | ICD-10-CM | POA: Diagnosis not present

## 2015-01-04 ENCOUNTER — Ambulatory Visit: Payer: Medicare Other | Admitting: Neurology

## 2015-01-09 DIAGNOSIS — N390 Urinary tract infection, site not specified: Secondary | ICD-10-CM | POA: Diagnosis not present

## 2015-01-15 DIAGNOSIS — Z7901 Long term (current) use of anticoagulants: Secondary | ICD-10-CM | POA: Diagnosis not present

## 2015-01-17 DIAGNOSIS — Z961 Presence of intraocular lens: Secondary | ICD-10-CM | POA: Diagnosis not present

## 2015-01-17 DIAGNOSIS — H401221 Low-tension glaucoma, left eye, mild stage: Secondary | ICD-10-CM | POA: Diagnosis not present

## 2015-01-17 DIAGNOSIS — H401211 Low-tension glaucoma, right eye, mild stage: Secondary | ICD-10-CM | POA: Diagnosis not present

## 2015-01-17 DIAGNOSIS — H43391 Other vitreous opacities, right eye: Secondary | ICD-10-CM | POA: Diagnosis not present

## 2015-02-07 DIAGNOSIS — G4700 Insomnia, unspecified: Secondary | ICD-10-CM | POA: Diagnosis not present

## 2015-02-07 DIAGNOSIS — R7301 Impaired fasting glucose: Secondary | ICD-10-CM | POA: Diagnosis not present

## 2015-02-07 DIAGNOSIS — Z86711 Personal history of pulmonary embolism: Secondary | ICD-10-CM | POA: Diagnosis not present

## 2015-02-07 DIAGNOSIS — I712 Thoracic aortic aneurysm, without rupture: Secondary | ICD-10-CM | POA: Diagnosis not present

## 2015-02-07 DIAGNOSIS — R5383 Other fatigue: Secondary | ICD-10-CM | POA: Diagnosis not present

## 2015-02-07 DIAGNOSIS — Z23 Encounter for immunization: Secondary | ICD-10-CM | POA: Diagnosis not present

## 2015-02-12 DIAGNOSIS — R7301 Impaired fasting glucose: Secondary | ICD-10-CM | POA: Diagnosis not present

## 2015-02-12 DIAGNOSIS — R5383 Other fatigue: Secondary | ICD-10-CM | POA: Diagnosis not present

## 2015-02-12 DIAGNOSIS — Z7901 Long term (current) use of anticoagulants: Secondary | ICD-10-CM | POA: Diagnosis not present

## 2015-02-12 DIAGNOSIS — R51 Headache: Secondary | ICD-10-CM | POA: Diagnosis not present

## 2015-03-21 DIAGNOSIS — Z7901 Long term (current) use of anticoagulants: Secondary | ICD-10-CM | POA: Diagnosis not present

## 2015-04-11 DIAGNOSIS — Z7901 Long term (current) use of anticoagulants: Secondary | ICD-10-CM | POA: Diagnosis not present

## 2015-05-09 DIAGNOSIS — N390 Urinary tract infection, site not specified: Secondary | ICD-10-CM | POA: Diagnosis not present

## 2015-05-14 DIAGNOSIS — Z7901 Long term (current) use of anticoagulants: Secondary | ICD-10-CM | POA: Diagnosis not present

## 2015-05-16 ENCOUNTER — Ambulatory Visit (INDEPENDENT_AMBULATORY_CARE_PROVIDER_SITE_OTHER): Payer: Medicare Other | Admitting: Endocrinology

## 2015-05-16 ENCOUNTER — Encounter: Payer: Self-pay | Admitting: Endocrinology

## 2015-05-16 VITALS — BP 122/64 | HR 66 | Temp 97.9°F | Ht 64.0 in | Wt 115.0 lb

## 2015-05-16 DIAGNOSIS — M810 Age-related osteoporosis without current pathological fracture: Secondary | ICD-10-CM

## 2015-05-16 DIAGNOSIS — E0590 Thyrotoxicosis, unspecified without thyrotoxic crisis or storm: Secondary | ICD-10-CM

## 2015-05-16 LAB — T4, FREE: Free T4: 0.96 ng/dL (ref 0.60–1.60)

## 2015-05-16 LAB — TSH: TSH: 2.25 u[IU]/mL (ref 0.35–4.50)

## 2015-05-16 LAB — VITAMIN D 25 HYDROXY (VIT D DEFICIENCY, FRACTURES): VITD: 99.54 ng/mL (ref 30.00–100.00)

## 2015-05-16 NOTE — Progress Notes (Signed)
Subjective:    Patient ID: Kelsey Griffin, female    DOB: [birth year], 82 y.o.   MRN: [id number]  HPI In 2014, pt was noted to have slightly low TSH.  Prior values had been normal.  She has never had dedicated thyroid imaging.  In 2014, she took tapazole a few weeks only.  It was stopped due to improved TFT.  Pt does not notice any swelling at the neck.  Pt was recently noted to have slight hypercalcemia. she has never had parathyroid probs, sarcoidosis, cancer, PUD, pancreatitis, or bony fracture.  He does not take A supplement.  Pt has no h/o any of these: [place], or prolonged immobilization.  Pt denies taking antacids, Li++, or HCTZ. She took fosamax x 5 years (none since 2014--she stopped due to GI sxs and lack of improvement).  She had several vertebral fractures with a fall in 2015.  She takes vitamin-D, 4000 units BID.     Past Medical History  Diagnosis Date  . Fibromyalgia   . Osteoporosis   . Mitral valve prolapse   . Aortic stenosis     mild  . Bladder infection, chronic   . Diabetes mellitus, type 2 ([place])   . Depression   . GERD (gastroesophageal reflux disease)   . Insomnia   . Anxiety   . Stroke ([place])   . Arthritis   . Seasonal allergies   . COPD (chronic obstructive pulmonary disease) ([place])   . Thoracic aortic aneurysm ([place])   . Uterine prolapse   . Cerebral infarction, chronic 2012    left parietal  . Atrial fibrillation ([place])   . Memory difficulties 07/04/2014  . Gait disorder 07/04/2014    Past Surgical History  Procedure Laterality Date  . Ovarian cyst resection on left  1958  . Bladder tack  1959, [id number]  . Breast lump removed  1962, 1983  . Appendectomy    . Eye surgery    . Hernia repair    . Tubal ligation      Social History   Social History  . Marital Status: Married    Spouse Name: N/A  . Number of Children: 3  . Years of Education: GED   Occupational History  . retired    Social History Main Topics  . Smoking status: Never Smoker   .  Smokeless tobacco: Never Used  . Alcohol Use: No  . Drug Use: No  . Sexual Activity: Not on file   Other Topics Concern  . Not on file   Social History Narrative   Lives with Husband   Patient is left handed.   Patient does not drink caffeine.    Current Outpatient Prescriptions on File Prior to Visit  Medication Sig Dispense Refill  . ALPRAZolam (XANAX) 0.5 MG tablet Take 0.25 mg by mouth 3 (three) times daily as needed.     Marland Kitchen amitriptyline (ELAVIL) 25 MG tablet Take 50 mg by mouth at bedtime.     Marland Kitchen BIOTIN PO Take 1 tablet by mouth daily.    . cholecalciferol (VITAMIN D) 400 UNITS TABS tablet Take 400 Units by mouth daily.    Marland Kitchen co-enzyme Q-10 30 MG capsule Take 30 mg by mouth 2 (two) times daily.     . cyanocobalamin (,VITAMIN B-12,) 1000 MCG/ML injection Inject 1,000 mcg into the muscle every 14 (fourteen) days.    . fish oil-omega-3 fatty acids 1000 MG capsule Take 2 g by mouth daily.    . Magnesium 100 MG  TABS Take by mouth daily.    . Manganese 50 MG TABS Take by mouth daily.    . Multiple Vitamins-Minerals (MULTIVITAMIN PO) Take by mouth 2 (two) times daily.     Marland Kitchen thiamine 100 MG tablet Take 100 mg by mouth daily.    . vitamin E 400 UNIT capsule Take 400 Units by mouth every other day.     . warfarin (COUMADIN) 3 MG tablet Take 3 mg by mouth daily. Or as directed    . Calcium Carb-Cholecalciferol (CALCIUM 500 +D) 500-400 MG-UNIT TABS Take by mouth 2 (two) times daily. Reported on 05/16/2015     No current facility-administered medications on file prior to visit.    Allergies  Allergen Reactions  . Bisphosphonates     REACTION: states intol to all bisphos \\T \ Evista too...  . Cefuroxime Axetil     nausea  . Codeine     REACTION: nausea  . Escitalopram Oxalate     REACTION: nervousness  . Macrobid [Nitrofurantoin Monohyd Macro]     Abdominal pain  . Milnacipran     REACTION: blurred vision, cough  . Penicillins     REACTION: rash  . Seroquel [Quetiapine Fumarate]      Confusion and lost muscle tone  . Tramadol     Swelling in lips  . Venlafaxine     REACTION: vomiting and flusing  . Zithromax [Azithromycin]     Felt like coming out of skin  . Sulfa Antibiotics Rash    Family History  Problem Relation Age of Onset  . Diabetes Maternal Grandmother   . Heart disease Maternal Grandmother   . Heart attack Maternal Grandmother   . Breast cancer Sister   . Diabetes Sister   . Ovarian cancer Sister   . GER disease Sister   . Anxiety disorder Sister   . Diabetes Daughter   . Anxiety disorder Daughter   . Kidney disease Mother   . Pneumonia Father   . Diabetes Brother   . Thyroid disease Mother     benign goiter    BP 122/64 mmHg  Pulse 66  Temp(Src) 97.9 F (36.6 C) (Oral)  Ht 5\' 4"  (1.626 m)  Wt 115 lb (52.164 kg)  BMI 19.73 kg/m2  SpO2 97%  Review of Systems denies weight loss, hematuria, numbness, urinary frequency, skin rash, and sob.   Depression is well-controlled.  She has constipation.     Objective:   Physical Exam VS: see vs page GEN: no distress.  In wheelchair HEAD: head: no deformity eyes: no periorbital swelling, no proptosis external nose and ears are normal mouth: no lesion seen NECK: supple, thyroid is not enlarged CHEST WALL: no deformity.  No kyphosis.   LUNGS:  Clear to auscultation CV: reg rate and rhythm, no murmur MUSCULOSKELETAL: muscle bulk and strength are grossly normal.  no obvious joint swelling.  gait is normal and steady EXTEMITIES: no deformity.  no ulcer on the feet.  feet are of normal color and temp.  no edema PULSES: dorsalis pedis intact bilat.  no carotid bruit NEURO:  cn 2-12 grossly intact.   readily moves all 4's.  sensation is intact to touch on the feet SKIN:  Normal texture and temperature.  No rash or suspicious lesion is visible.   NODES:  None palpable at the neck PSYCH: alert, well-oriented.  Does not appear anxious nor depressed.   Radiol: CT: urolithiasis.    Lab Results    Component Value Date   TSH  2.25 05/16/2015   Vit-D=100    Assessment & Plan:  Osteoporosis: she declines reclast. Vit-D deficiency: overreplaced Urolithiasis: possibly due to to high vit-D Hyperthyroidism, mild, usually due to small multinodular goiter, normal on recheck, but she is at risk for recurrence.    Patient is advised the following: Patient Instructions  Please come back for a follow-up appointment in 6 months.  blood tests are requested for you today.  We'll let you know about the results. most of the time, a "lumpy thyroid" will eventually become overactive again.  If this happens, you should take a radioactive iodine pill.   ot works like this: We would first check a thyroid "scan" (a special, but easy and painless type of thyroid x ray).  you go to the x-ray department of the hospital to swallow a pill, which contains a miniscule amount of radiation.  You will not notice any symptoms from this.  You will go back to the x-ray department the next day, to lie down in front of a camera.  The results of this will be sent to me.   Based on the results, i hope to order for you a treatment pill of radioactive iodine.  Although it is a larger amount of radiation, you will again notice no symptoms from this.  The pill is gone from your body in a few days (during which you should stay away from other people), but takes several months to work.  Therefore, please return here approximately 6-8 weeks after the treatment.  This treatment has been available for many years, and the only known side-effect is an underactive thyroid.  It is possible that i would eventually prescribe for you a thyroid hormone pill, which is very inexpensive.  You don't have to worry about side-effects of this thyroid hormone pill, because it is the same molecule your thyroid makes. Let's recheck the bone density test.  you will receive a phone call, about a day and time for an appointment.    addendum: d/c vit-D

## 2015-05-16 NOTE — Patient Instructions (Addendum)
Please come back for a follow-up appointment in 6 months.  blood tests are requested for you today.  We'll let you know about the results. most of the time, a "lumpy thyroid" will eventually become overactive again.  If this happens, you should take a radioactive iodine pill.   ot works like this: We would first check a thyroid "scan" (a special, but easy and painless type of thyroid x ray).  you go to the x-ray department of the hospital to swallow a pill, which contains a miniscule amount of radiation.  You will not notice any symptoms from this.  You will go back to the x-ray department the next day, to lie down in front of a camera.  The results of this will be sent to me.   Based on the results, i hope to order for you a treatment pill of radioactive iodine.  Although it is a larger amount of radiation, you will again notice no symptoms from this.  The pill is gone from your body in a few days (during which you should stay away from other people), but takes several months to work.  Therefore, please return here approximately 6-8 weeks after the treatment.  This treatment has been available for many years, and the only known side-effect is an underactive thyroid.  It is possible that i would eventually prescribe for you a thyroid hormone pill, which is very inexpensive.  You don't have to worry about side-effects of this thyroid hormone pill, because it is the same molecule your thyroid makes. Let's recheck the bone density test.  you will receive a phone call, about a day and time for an appointment.

## 2015-05-17 ENCOUNTER — Telehealth: Payer: Self-pay | Admitting: Endocrinology

## 2015-05-17 LAB — PTH, INTACT AND CALCIUM
Calcium: 10.8 mg/dL — ABNORMAL HIGH (ref 8.4–10.5)
PTH: 43 pg/mL (ref 14–64)

## 2015-05-17 NOTE — Telephone Encounter (Signed)
please call patient: Vitamin-D is high.  Please d/c vitamin-D I have reviewed your records, and I still need to clarify: Have you ever had a thyroid US?

## 2015-05-18 LAB — PROTEIN ELECTROPHORESIS, SERUM
ALPHA-1-GLOBULIN: 0.3 g/dL (ref 0.2–0.3)
Albumin ELP: 4.1 g/dL (ref 3.8–4.8)
Alpha-2-Globulin: 0.6 g/dL (ref 0.5–0.9)
BETA 2: 0.3 g/dL (ref 0.2–0.5)
BETA GLOBULIN: 0.3 g/dL — AB (ref 0.4–0.6)
GAMMA GLOBULIN: 0.8 g/dL (ref 0.8–1.7)
TOTAL PROTEIN, SERUM ELECTROPHOR: 6.4 g/dL (ref 6.1–8.1)

## 2015-05-18 NOTE — Telephone Encounter (Signed)
I contacted the pt and she stated she has never had a thyroid US.

## 2015-05-18 NOTE — Telephone Encounter (Signed)
Pt advised of note below and voiced understanding.  

## 2015-05-18 NOTE — Telephone Encounter (Signed)
Ok, you don't need one that I know of--just checking to make sure

## 2015-05-20 ENCOUNTER — Other Ambulatory Visit: Payer: Self-pay | Admitting: Endocrinology

## 2015-05-24 ENCOUNTER — Encounter: Payer: Self-pay | Admitting: Endocrinology

## 2015-05-25 DIAGNOSIS — R05 Cough: Secondary | ICD-10-CM | POA: Diagnosis not present

## 2015-05-25 DIAGNOSIS — J40 Bronchitis, not specified as acute or chronic: Secondary | ICD-10-CM | POA: Diagnosis not present

## 2015-06-13 DIAGNOSIS — Z7901 Long term (current) use of anticoagulants: Secondary | ICD-10-CM | POA: Diagnosis not present

## 2015-06-27 DIAGNOSIS — Z7901 Long term (current) use of anticoagulants: Secondary | ICD-10-CM | POA: Diagnosis not present

## 2015-07-02 ENCOUNTER — Telehealth: Payer: Self-pay | Admitting: Endocrinology

## 2015-07-02 NOTE — Telephone Encounter (Signed)
PT daughter would like to know if they need to come here to get her labs or if she can get them done at her PCP office

## 2015-07-02 NOTE — Telephone Encounter (Signed)
Se note below and please advise, Thanks!

## 2015-07-02 NOTE — Telephone Encounter (Signed)
Left a vm advising of note below. Requested a call back if the pt's daughter would like to discuss.

## 2015-07-02 NOTE — Telephone Encounter (Signed)
You need to redo the calcium and vitamin-D blood tests soon at your PCP. If you want, you can also follow the thyroid blood tests twice a year, with your PCP, and just come back here prn

## 2015-07-03 NOTE — Telephone Encounter (Signed)
PT daughter said that the fax number for the PCP for lab orders is [phone]

## 2015-07-04 ENCOUNTER — Other Ambulatory Visit: Payer: Medicare Other

## 2015-07-10 ENCOUNTER — Telehealth: Payer: Self-pay | Admitting: Endocrinology

## 2015-07-10 NOTE — Telephone Encounter (Signed)
Orders refaxed.

## 2015-07-10 NOTE — Telephone Encounter (Signed)
Lab orders were never received by the MD office please resend to the number in the previous message

## 2015-07-11 DIAGNOSIS — Z7901 Long term (current) use of anticoagulants: Secondary | ICD-10-CM | POA: Diagnosis not present

## 2015-07-26 DIAGNOSIS — Z5181 Encounter for therapeutic drug level monitoring: Secondary | ICD-10-CM | POA: Diagnosis not present

## 2015-07-26 DIAGNOSIS — L989 Disorder of the skin and subcutaneous tissue, unspecified: Secondary | ICD-10-CM | POA: Diagnosis not present

## 2015-07-26 DIAGNOSIS — F329 Major depressive disorder, single episode, unspecified: Secondary | ICD-10-CM | POA: Diagnosis not present

## 2015-07-26 DIAGNOSIS — M1991 Primary osteoarthritis, unspecified site: Secondary | ICD-10-CM | POA: Diagnosis not present

## 2015-07-26 DIAGNOSIS — F419 Anxiety disorder, unspecified: Secondary | ICD-10-CM | POA: Diagnosis not present

## 2015-07-26 DIAGNOSIS — I350 Nonrheumatic aortic (valve) stenosis: Secondary | ICD-10-CM | POA: Diagnosis not present

## 2015-07-26 DIAGNOSIS — Z8673 Personal history of transient ischemic attack (TIA), and cerebral infarction without residual deficits: Secondary | ICD-10-CM | POA: Diagnosis not present

## 2015-07-26 DIAGNOSIS — I712 Thoracic aortic aneurysm, without rupture: Secondary | ICD-10-CM | POA: Diagnosis not present

## 2015-07-26 DIAGNOSIS — J449 Chronic obstructive pulmonary disease, unspecified: Secondary | ICD-10-CM | POA: Diagnosis not present

## 2015-07-26 DIAGNOSIS — M549 Dorsalgia, unspecified: Secondary | ICD-10-CM | POA: Diagnosis not present

## 2015-07-26 DIAGNOSIS — Z86711 Personal history of pulmonary embolism: Secondary | ICD-10-CM | POA: Diagnosis not present

## 2015-07-26 DIAGNOSIS — Z7901 Long term (current) use of anticoagulants: Secondary | ICD-10-CM | POA: Diagnosis not present

## 2015-07-26 DIAGNOSIS — F0390 Unspecified dementia without behavioral disturbance: Secondary | ICD-10-CM | POA: Diagnosis not present

## 2015-07-26 DIAGNOSIS — M797 Fibromyalgia: Secondary | ICD-10-CM | POA: Diagnosis not present

## 2015-07-26 DIAGNOSIS — M810 Age-related osteoporosis without current pathological fracture: Secondary | ICD-10-CM | POA: Diagnosis not present

## 2015-07-27 DIAGNOSIS — Z7901 Long term (current) use of anticoagulants: Secondary | ICD-10-CM | POA: Diagnosis not present

## 2015-07-27 DIAGNOSIS — L989 Disorder of the skin and subcutaneous tissue, unspecified: Secondary | ICD-10-CM | POA: Diagnosis not present

## 2015-07-27 DIAGNOSIS — J449 Chronic obstructive pulmonary disease, unspecified: Secondary | ICD-10-CM | POA: Diagnosis not present

## 2015-07-27 DIAGNOSIS — F329 Major depressive disorder, single episode, unspecified: Secondary | ICD-10-CM | POA: Diagnosis not present

## 2015-07-27 DIAGNOSIS — M549 Dorsalgia, unspecified: Secondary | ICD-10-CM | POA: Diagnosis not present

## 2015-07-27 DIAGNOSIS — F0390 Unspecified dementia without behavioral disturbance: Secondary | ICD-10-CM | POA: Diagnosis not present

## 2015-07-27 DIAGNOSIS — M1991 Primary osteoarthritis, unspecified site: Secondary | ICD-10-CM | POA: Diagnosis not present

## 2015-07-29 DIAGNOSIS — L989 Disorder of the skin and subcutaneous tissue, unspecified: Secondary | ICD-10-CM | POA: Diagnosis not present

## 2015-07-29 DIAGNOSIS — J449 Chronic obstructive pulmonary disease, unspecified: Secondary | ICD-10-CM | POA: Diagnosis not present

## 2015-07-29 DIAGNOSIS — F0390 Unspecified dementia without behavioral disturbance: Secondary | ICD-10-CM | POA: Diagnosis not present

## 2015-07-29 DIAGNOSIS — F329 Major depressive disorder, single episode, unspecified: Secondary | ICD-10-CM | POA: Diagnosis not present

## 2015-07-29 DIAGNOSIS — M1991 Primary osteoarthritis, unspecified site: Secondary | ICD-10-CM | POA: Diagnosis not present

## 2015-07-29 DIAGNOSIS — M549 Dorsalgia, unspecified: Secondary | ICD-10-CM | POA: Diagnosis not present

## 2015-07-30 DIAGNOSIS — L989 Disorder of the skin and subcutaneous tissue, unspecified: Secondary | ICD-10-CM | POA: Diagnosis not present

## 2015-07-30 DIAGNOSIS — I712 Thoracic aortic aneurysm, without rupture: Secondary | ICD-10-CM | POA: Diagnosis not present

## 2015-07-30 DIAGNOSIS — F329 Major depressive disorder, single episode, unspecified: Secondary | ICD-10-CM | POA: Diagnosis not present

## 2015-07-30 DIAGNOSIS — M810 Age-related osteoporosis without current pathological fracture: Secondary | ICD-10-CM | POA: Diagnosis not present

## 2015-07-30 DIAGNOSIS — F419 Anxiety disorder, unspecified: Secondary | ICD-10-CM | POA: Diagnosis not present

## 2015-07-30 DIAGNOSIS — F0390 Unspecified dementia without behavioral disturbance: Secondary | ICD-10-CM | POA: Diagnosis not present

## 2015-07-30 DIAGNOSIS — M1991 Primary osteoarthritis, unspecified site: Secondary | ICD-10-CM | POA: Diagnosis not present

## 2015-07-30 DIAGNOSIS — M797 Fibromyalgia: Secondary | ICD-10-CM | POA: Diagnosis not present

## 2015-07-30 DIAGNOSIS — M549 Dorsalgia, unspecified: Secondary | ICD-10-CM | POA: Diagnosis not present

## 2015-07-30 DIAGNOSIS — J449 Chronic obstructive pulmonary disease, unspecified: Secondary | ICD-10-CM | POA: Diagnosis not present

## 2015-07-31 DIAGNOSIS — M1991 Primary osteoarthritis, unspecified site: Secondary | ICD-10-CM | POA: Diagnosis not present

## 2015-07-31 DIAGNOSIS — F0390 Unspecified dementia without behavioral disturbance: Secondary | ICD-10-CM | POA: Diagnosis not present

## 2015-07-31 DIAGNOSIS — M549 Dorsalgia, unspecified: Secondary | ICD-10-CM | POA: Diagnosis not present

## 2015-07-31 DIAGNOSIS — J449 Chronic obstructive pulmonary disease, unspecified: Secondary | ICD-10-CM | POA: Diagnosis not present

## 2015-07-31 DIAGNOSIS — L989 Disorder of the skin and subcutaneous tissue, unspecified: Secondary | ICD-10-CM | POA: Diagnosis not present

## 2015-07-31 DIAGNOSIS — F329 Major depressive disorder, single episode, unspecified: Secondary | ICD-10-CM | POA: Diagnosis not present

## 2015-08-01 DIAGNOSIS — M1991 Primary osteoarthritis, unspecified site: Secondary | ICD-10-CM | POA: Diagnosis not present

## 2015-08-01 DIAGNOSIS — F329 Major depressive disorder, single episode, unspecified: Secondary | ICD-10-CM | POA: Diagnosis not present

## 2015-08-01 DIAGNOSIS — F0390 Unspecified dementia without behavioral disturbance: Secondary | ICD-10-CM | POA: Diagnosis not present

## 2015-08-01 DIAGNOSIS — L989 Disorder of the skin and subcutaneous tissue, unspecified: Secondary | ICD-10-CM | POA: Diagnosis not present

## 2015-08-01 DIAGNOSIS — J449 Chronic obstructive pulmonary disease, unspecified: Secondary | ICD-10-CM | POA: Diagnosis not present

## 2015-08-01 DIAGNOSIS — M549 Dorsalgia, unspecified: Secondary | ICD-10-CM | POA: Diagnosis not present

## 2015-08-02 DIAGNOSIS — F329 Major depressive disorder, single episode, unspecified: Secondary | ICD-10-CM | POA: Diagnosis not present

## 2015-08-02 DIAGNOSIS — M1991 Primary osteoarthritis, unspecified site: Secondary | ICD-10-CM | POA: Diagnosis not present

## 2015-08-02 DIAGNOSIS — J449 Chronic obstructive pulmonary disease, unspecified: Secondary | ICD-10-CM | POA: Diagnosis not present

## 2015-08-02 DIAGNOSIS — L989 Disorder of the skin and subcutaneous tissue, unspecified: Secondary | ICD-10-CM | POA: Diagnosis not present

## 2015-08-02 DIAGNOSIS — M549 Dorsalgia, unspecified: Secondary | ICD-10-CM | POA: Diagnosis not present

## 2015-08-02 DIAGNOSIS — F0390 Unspecified dementia without behavioral disturbance: Secondary | ICD-10-CM | POA: Diagnosis not present

## 2015-08-06 DIAGNOSIS — M1991 Primary osteoarthritis, unspecified site: Secondary | ICD-10-CM | POA: Diagnosis not present

## 2015-08-06 DIAGNOSIS — F0390 Unspecified dementia without behavioral disturbance: Secondary | ICD-10-CM | POA: Diagnosis not present

## 2015-08-06 DIAGNOSIS — L989 Disorder of the skin and subcutaneous tissue, unspecified: Secondary | ICD-10-CM | POA: Diagnosis not present

## 2015-08-06 DIAGNOSIS — F329 Major depressive disorder, single episode, unspecified: Secondary | ICD-10-CM | POA: Diagnosis not present

## 2015-08-06 DIAGNOSIS — J449 Chronic obstructive pulmonary disease, unspecified: Secondary | ICD-10-CM | POA: Diagnosis not present

## 2015-08-06 DIAGNOSIS — M549 Dorsalgia, unspecified: Secondary | ICD-10-CM | POA: Diagnosis not present

## 2015-08-07 DIAGNOSIS — L989 Disorder of the skin and subcutaneous tissue, unspecified: Secondary | ICD-10-CM | POA: Diagnosis not present

## 2015-08-07 DIAGNOSIS — J449 Chronic obstructive pulmonary disease, unspecified: Secondary | ICD-10-CM | POA: Diagnosis not present

## 2015-08-07 DIAGNOSIS — F329 Major depressive disorder, single episode, unspecified: Secondary | ICD-10-CM | POA: Diagnosis not present

## 2015-08-07 DIAGNOSIS — M549 Dorsalgia, unspecified: Secondary | ICD-10-CM | POA: Diagnosis not present

## 2015-08-07 DIAGNOSIS — M1991 Primary osteoarthritis, unspecified site: Secondary | ICD-10-CM | POA: Diagnosis not present

## 2015-08-07 DIAGNOSIS — F0390 Unspecified dementia without behavioral disturbance: Secondary | ICD-10-CM | POA: Diagnosis not present

## 2015-08-08 DIAGNOSIS — F329 Major depressive disorder, single episode, unspecified: Secondary | ICD-10-CM | POA: Diagnosis not present

## 2015-08-08 DIAGNOSIS — M1991 Primary osteoarthritis, unspecified site: Secondary | ICD-10-CM | POA: Diagnosis not present

## 2015-08-08 DIAGNOSIS — L989 Disorder of the skin and subcutaneous tissue, unspecified: Secondary | ICD-10-CM | POA: Diagnosis not present

## 2015-08-08 DIAGNOSIS — F0390 Unspecified dementia without behavioral disturbance: Secondary | ICD-10-CM | POA: Diagnosis not present

## 2015-08-08 DIAGNOSIS — M549 Dorsalgia, unspecified: Secondary | ICD-10-CM | POA: Diagnosis not present

## 2015-08-08 DIAGNOSIS — J449 Chronic obstructive pulmonary disease, unspecified: Secondary | ICD-10-CM | POA: Diagnosis not present

## 2015-08-09 DIAGNOSIS — M1991 Primary osteoarthritis, unspecified site: Secondary | ICD-10-CM | POA: Diagnosis not present

## 2015-08-09 DIAGNOSIS — F329 Major depressive disorder, single episode, unspecified: Secondary | ICD-10-CM | POA: Diagnosis not present

## 2015-08-09 DIAGNOSIS — J449 Chronic obstructive pulmonary disease, unspecified: Secondary | ICD-10-CM | POA: Diagnosis not present

## 2015-08-09 DIAGNOSIS — M549 Dorsalgia, unspecified: Secondary | ICD-10-CM | POA: Diagnosis not present

## 2015-08-09 DIAGNOSIS — L989 Disorder of the skin and subcutaneous tissue, unspecified: Secondary | ICD-10-CM | POA: Diagnosis not present

## 2015-08-09 DIAGNOSIS — F0390 Unspecified dementia without behavioral disturbance: Secondary | ICD-10-CM | POA: Diagnosis not present

## 2015-08-10 DIAGNOSIS — M1991 Primary osteoarthritis, unspecified site: Secondary | ICD-10-CM | POA: Diagnosis not present

## 2015-08-10 DIAGNOSIS — J449 Chronic obstructive pulmonary disease, unspecified: Secondary | ICD-10-CM | POA: Diagnosis not present

## 2015-08-10 DIAGNOSIS — F0390 Unspecified dementia without behavioral disturbance: Secondary | ICD-10-CM | POA: Diagnosis not present

## 2015-08-10 DIAGNOSIS — M549 Dorsalgia, unspecified: Secondary | ICD-10-CM | POA: Diagnosis not present

## 2015-08-10 DIAGNOSIS — L989 Disorder of the skin and subcutaneous tissue, unspecified: Secondary | ICD-10-CM | POA: Diagnosis not present

## 2015-08-10 DIAGNOSIS — F329 Major depressive disorder, single episode, unspecified: Secondary | ICD-10-CM | POA: Diagnosis not present

## 2015-08-13 DIAGNOSIS — J449 Chronic obstructive pulmonary disease, unspecified: Secondary | ICD-10-CM | POA: Diagnosis not present

## 2015-08-13 DIAGNOSIS — M1991 Primary osteoarthritis, unspecified site: Secondary | ICD-10-CM | POA: Diagnosis not present

## 2015-08-13 DIAGNOSIS — M549 Dorsalgia, unspecified: Secondary | ICD-10-CM | POA: Diagnosis not present

## 2015-08-13 DIAGNOSIS — F329 Major depressive disorder, single episode, unspecified: Secondary | ICD-10-CM | POA: Diagnosis not present

## 2015-08-13 DIAGNOSIS — L989 Disorder of the skin and subcutaneous tissue, unspecified: Secondary | ICD-10-CM | POA: Diagnosis not present

## 2015-08-13 DIAGNOSIS — F0390 Unspecified dementia without behavioral disturbance: Secondary | ICD-10-CM | POA: Diagnosis not present

## 2015-08-14 DIAGNOSIS — J449 Chronic obstructive pulmonary disease, unspecified: Secondary | ICD-10-CM | POA: Diagnosis not present

## 2015-08-14 DIAGNOSIS — F0390 Unspecified dementia without behavioral disturbance: Secondary | ICD-10-CM | POA: Diagnosis not present

## 2015-08-14 DIAGNOSIS — M549 Dorsalgia, unspecified: Secondary | ICD-10-CM | POA: Diagnosis not present

## 2015-08-14 DIAGNOSIS — F329 Major depressive disorder, single episode, unspecified: Secondary | ICD-10-CM | POA: Diagnosis not present

## 2015-08-14 DIAGNOSIS — L989 Disorder of the skin and subcutaneous tissue, unspecified: Secondary | ICD-10-CM | POA: Diagnosis not present

## 2015-08-14 DIAGNOSIS — M1991 Primary osteoarthritis, unspecified site: Secondary | ICD-10-CM | POA: Diagnosis not present

## 2015-08-15 DIAGNOSIS — H179 Unspecified corneal scar and opacity: Secondary | ICD-10-CM | POA: Diagnosis not present

## 2015-08-15 DIAGNOSIS — H04123 Dry eye syndrome of bilateral lacrimal glands: Secondary | ICD-10-CM | POA: Diagnosis not present

## 2015-08-15 DIAGNOSIS — H401231 Low-tension glaucoma, bilateral, mild stage: Secondary | ICD-10-CM | POA: Diagnosis not present

## 2015-08-15 DIAGNOSIS — H1851 Endothelial corneal dystrophy: Secondary | ICD-10-CM | POA: Diagnosis not present

## 2015-08-15 DIAGNOSIS — H401221 Low-tension glaucoma, left eye, mild stage: Secondary | ICD-10-CM | POA: Diagnosis not present

## 2015-08-15 DIAGNOSIS — H401211 Low-tension glaucoma, right eye, mild stage: Secondary | ICD-10-CM | POA: Diagnosis not present

## 2015-08-16 DIAGNOSIS — M1991 Primary osteoarthritis, unspecified site: Secondary | ICD-10-CM | POA: Diagnosis not present

## 2015-08-16 DIAGNOSIS — F329 Major depressive disorder, single episode, unspecified: Secondary | ICD-10-CM | POA: Diagnosis not present

## 2015-08-16 DIAGNOSIS — M549 Dorsalgia, unspecified: Secondary | ICD-10-CM | POA: Diagnosis not present

## 2015-08-16 DIAGNOSIS — L989 Disorder of the skin and subcutaneous tissue, unspecified: Secondary | ICD-10-CM | POA: Diagnosis not present

## 2015-08-16 DIAGNOSIS — F0390 Unspecified dementia without behavioral disturbance: Secondary | ICD-10-CM | POA: Diagnosis not present

## 2015-08-16 DIAGNOSIS — J449 Chronic obstructive pulmonary disease, unspecified: Secondary | ICD-10-CM | POA: Diagnosis not present

## 2015-08-21 DIAGNOSIS — M1991 Primary osteoarthritis, unspecified site: Secondary | ICD-10-CM | POA: Diagnosis not present

## 2015-08-21 DIAGNOSIS — M549 Dorsalgia, unspecified: Secondary | ICD-10-CM | POA: Diagnosis not present

## 2015-08-21 DIAGNOSIS — L989 Disorder of the skin and subcutaneous tissue, unspecified: Secondary | ICD-10-CM | POA: Diagnosis not present

## 2015-08-21 DIAGNOSIS — F329 Major depressive disorder, single episode, unspecified: Secondary | ICD-10-CM | POA: Diagnosis not present

## 2015-08-21 DIAGNOSIS — J449 Chronic obstructive pulmonary disease, unspecified: Secondary | ICD-10-CM | POA: Diagnosis not present

## 2015-08-21 DIAGNOSIS — F0390 Unspecified dementia without behavioral disturbance: Secondary | ICD-10-CM | POA: Diagnosis not present

## 2015-08-22 DIAGNOSIS — F0390 Unspecified dementia without behavioral disturbance: Secondary | ICD-10-CM | POA: Diagnosis not present

## 2015-08-22 DIAGNOSIS — M1991 Primary osteoarthritis, unspecified site: Secondary | ICD-10-CM | POA: Diagnosis not present

## 2015-08-22 DIAGNOSIS — L989 Disorder of the skin and subcutaneous tissue, unspecified: Secondary | ICD-10-CM | POA: Diagnosis not present

## 2015-08-22 DIAGNOSIS — Z7901 Long term (current) use of anticoagulants: Secondary | ICD-10-CM | POA: Diagnosis not present

## 2015-08-22 DIAGNOSIS — M549 Dorsalgia, unspecified: Secondary | ICD-10-CM | POA: Diagnosis not present

## 2015-08-22 DIAGNOSIS — F329 Major depressive disorder, single episode, unspecified: Secondary | ICD-10-CM | POA: Diagnosis not present

## 2015-08-22 DIAGNOSIS — J449 Chronic obstructive pulmonary disease, unspecified: Secondary | ICD-10-CM | POA: Diagnosis not present

## 2015-08-22 DIAGNOSIS — Z8673 Personal history of transient ischemic attack (TIA), and cerebral infarction without residual deficits: Secondary | ICD-10-CM | POA: Diagnosis not present

## 2015-08-22 DIAGNOSIS — N390 Urinary tract infection, site not specified: Secondary | ICD-10-CM | POA: Diagnosis not present

## 2015-08-23 DIAGNOSIS — J449 Chronic obstructive pulmonary disease, unspecified: Secondary | ICD-10-CM | POA: Diagnosis not present

## 2015-08-23 DIAGNOSIS — F0390 Unspecified dementia without behavioral disturbance: Secondary | ICD-10-CM | POA: Diagnosis not present

## 2015-08-23 DIAGNOSIS — M1991 Primary osteoarthritis, unspecified site: Secondary | ICD-10-CM | POA: Diagnosis not present

## 2015-08-23 DIAGNOSIS — M549 Dorsalgia, unspecified: Secondary | ICD-10-CM | POA: Diagnosis not present

## 2015-08-23 DIAGNOSIS — F329 Major depressive disorder, single episode, unspecified: Secondary | ICD-10-CM | POA: Diagnosis not present

## 2015-08-23 DIAGNOSIS — L989 Disorder of the skin and subcutaneous tissue, unspecified: Secondary | ICD-10-CM | POA: Diagnosis not present

## 2015-08-29 DIAGNOSIS — M1991 Primary osteoarthritis, unspecified site: Secondary | ICD-10-CM | POA: Diagnosis not present

## 2015-08-29 DIAGNOSIS — L989 Disorder of the skin and subcutaneous tissue, unspecified: Secondary | ICD-10-CM | POA: Diagnosis not present

## 2015-08-29 DIAGNOSIS — M549 Dorsalgia, unspecified: Secondary | ICD-10-CM | POA: Diagnosis not present

## 2015-08-29 DIAGNOSIS — F329 Major depressive disorder, single episode, unspecified: Secondary | ICD-10-CM | POA: Diagnosis not present

## 2015-08-29 DIAGNOSIS — F0390 Unspecified dementia without behavioral disturbance: Secondary | ICD-10-CM | POA: Diagnosis not present

## 2015-08-29 DIAGNOSIS — J449 Chronic obstructive pulmonary disease, unspecified: Secondary | ICD-10-CM | POA: Diagnosis not present

## 2015-08-30 DIAGNOSIS — F0390 Unspecified dementia without behavioral disturbance: Secondary | ICD-10-CM | POA: Diagnosis not present

## 2015-08-30 DIAGNOSIS — J449 Chronic obstructive pulmonary disease, unspecified: Secondary | ICD-10-CM | POA: Diagnosis not present

## 2015-08-30 DIAGNOSIS — L989 Disorder of the skin and subcutaneous tissue, unspecified: Secondary | ICD-10-CM | POA: Diagnosis not present

## 2015-08-30 DIAGNOSIS — F329 Major depressive disorder, single episode, unspecified: Secondary | ICD-10-CM | POA: Diagnosis not present

## 2015-08-30 DIAGNOSIS — M1991 Primary osteoarthritis, unspecified site: Secondary | ICD-10-CM | POA: Diagnosis not present

## 2015-08-30 DIAGNOSIS — M549 Dorsalgia, unspecified: Secondary | ICD-10-CM | POA: Diagnosis not present

## 2015-09-10 DIAGNOSIS — H40051 Ocular hypertension, right eye: Secondary | ICD-10-CM | POA: Diagnosis not present

## 2015-09-10 DIAGNOSIS — H401211 Low-tension glaucoma, right eye, mild stage: Secondary | ICD-10-CM | POA: Diagnosis not present

## 2015-09-24 DIAGNOSIS — Z7901 Long term (current) use of anticoagulants: Secondary | ICD-10-CM | POA: Diagnosis not present

## 2015-10-01 DIAGNOSIS — H401221 Low-tension glaucoma, left eye, mild stage: Secondary | ICD-10-CM | POA: Diagnosis not present

## 2015-10-03 DIAGNOSIS — R7301 Impaired fasting glucose: Secondary | ICD-10-CM | POA: Diagnosis not present

## 2015-10-03 DIAGNOSIS — Z86711 Personal history of pulmonary embolism: Secondary | ICD-10-CM | POA: Diagnosis not present

## 2015-10-03 DIAGNOSIS — Z7901 Long term (current) use of anticoagulants: Secondary | ICD-10-CM | POA: Diagnosis not present

## 2015-10-03 DIAGNOSIS — E039 Hypothyroidism, unspecified: Secondary | ICD-10-CM | POA: Diagnosis not present

## 2015-10-03 DIAGNOSIS — M150 Primary generalized (osteo)arthritis: Secondary | ICD-10-CM | POA: Diagnosis not present

## 2015-10-04 DIAGNOSIS — R7301 Impaired fasting glucose: Secondary | ICD-10-CM | POA: Diagnosis not present

## 2015-10-04 DIAGNOSIS — Z7901 Long term (current) use of anticoagulants: Secondary | ICD-10-CM | POA: Diagnosis not present

## 2015-10-24 DIAGNOSIS — Z7901 Long term (current) use of anticoagulants: Secondary | ICD-10-CM | POA: Diagnosis not present

## 2015-11-14 ENCOUNTER — Ambulatory Visit: Payer: Medicare Other | Admitting: Endocrinology

## 2015-11-23 DIAGNOSIS — Z7901 Long term (current) use of anticoagulants: Secondary | ICD-10-CM | POA: Diagnosis not present

## 2015-12-26 DIAGNOSIS — Z7901 Long term (current) use of anticoagulants: Secondary | ICD-10-CM | POA: Diagnosis not present

## 2015-12-26 DIAGNOSIS — Z23 Encounter for immunization: Secondary | ICD-10-CM | POA: Diagnosis not present

## 2016-01-31 DIAGNOSIS — E538 Deficiency of other specified B group vitamins: Secondary | ICD-10-CM | POA: Diagnosis not present

## 2016-01-31 DIAGNOSIS — Z7901 Long term (current) use of anticoagulants: Secondary | ICD-10-CM | POA: Diagnosis not present

## 2016-03-06 DIAGNOSIS — Z7901 Long term (current) use of anticoagulants: Secondary | ICD-10-CM | POA: Diagnosis not present

## 2016-03-06 DIAGNOSIS — R531 Weakness: Secondary | ICD-10-CM | POA: Diagnosis not present

## 2016-03-06 DIAGNOSIS — R69 Illness, unspecified: Secondary | ICD-10-CM | POA: Diagnosis not present

## 2016-03-06 DIAGNOSIS — M549 Dorsalgia, unspecified: Secondary | ICD-10-CM | POA: Diagnosis not present

## 2016-03-06 DIAGNOSIS — R110 Nausea: Secondary | ICD-10-CM | POA: Diagnosis not present

## 2016-03-06 DIAGNOSIS — R5383 Other fatigue: Secondary | ICD-10-CM | POA: Diagnosis not present

## 2016-03-06 DIAGNOSIS — N390 Urinary tract infection, site not specified: Secondary | ICD-10-CM | POA: Diagnosis not present

## 2016-03-06 DIAGNOSIS — H9201 Otalgia, right ear: Secondary | ICD-10-CM | POA: Diagnosis not present

## 2016-03-06 DIAGNOSIS — R7301 Impaired fasting glucose: Secondary | ICD-10-CM | POA: Diagnosis not present

## 2016-03-31 DIAGNOSIS — M6281 Muscle weakness (generalized): Secondary | ICD-10-CM | POA: Diagnosis not present

## 2016-03-31 DIAGNOSIS — M150 Primary generalized (osteo)arthritis: Secondary | ICD-10-CM | POA: Diagnosis not present

## 2016-03-31 DIAGNOSIS — R2689 Other abnormalities of gait and mobility: Secondary | ICD-10-CM | POA: Diagnosis not present

## 2016-03-31 DIAGNOSIS — M549 Dorsalgia, unspecified: Secondary | ICD-10-CM | POA: Diagnosis not present

## 2016-03-31 DIAGNOSIS — Z9181 History of falling: Secondary | ICD-10-CM | POA: Diagnosis not present

## 2016-03-31 DIAGNOSIS — M797 Fibromyalgia: Secondary | ICD-10-CM | POA: Diagnosis not present

## 2016-03-31 DIAGNOSIS — J449 Chronic obstructive pulmonary disease, unspecified: Secondary | ICD-10-CM | POA: Diagnosis not present

## 2016-03-31 DIAGNOSIS — R69 Illness, unspecified: Secondary | ICD-10-CM | POA: Diagnosis not present

## 2016-03-31 DIAGNOSIS — Z7901 Long term (current) use of anticoagulants: Secondary | ICD-10-CM | POA: Diagnosis not present

## 2016-04-02 DIAGNOSIS — M797 Fibromyalgia: Secondary | ICD-10-CM | POA: Diagnosis not present

## 2016-04-02 DIAGNOSIS — Z9181 History of falling: Secondary | ICD-10-CM | POA: Diagnosis not present

## 2016-04-02 DIAGNOSIS — J449 Chronic obstructive pulmonary disease, unspecified: Secondary | ICD-10-CM | POA: Diagnosis not present

## 2016-04-02 DIAGNOSIS — M150 Primary generalized (osteo)arthritis: Secondary | ICD-10-CM | POA: Diagnosis not present

## 2016-04-02 DIAGNOSIS — M549 Dorsalgia, unspecified: Secondary | ICD-10-CM | POA: Diagnosis not present

## 2016-04-02 DIAGNOSIS — R2689 Other abnormalities of gait and mobility: Secondary | ICD-10-CM | POA: Diagnosis not present

## 2016-04-02 DIAGNOSIS — R69 Illness, unspecified: Secondary | ICD-10-CM | POA: Diagnosis not present

## 2016-04-02 DIAGNOSIS — Z7901 Long term (current) use of anticoagulants: Secondary | ICD-10-CM | POA: Diagnosis not present

## 2016-04-02 DIAGNOSIS — M6281 Muscle weakness (generalized): Secondary | ICD-10-CM | POA: Diagnosis not present

## 2016-04-08 DIAGNOSIS — M549 Dorsalgia, unspecified: Secondary | ICD-10-CM | POA: Diagnosis not present

## 2016-04-08 DIAGNOSIS — M797 Fibromyalgia: Secondary | ICD-10-CM | POA: Diagnosis not present

## 2016-04-08 DIAGNOSIS — J449 Chronic obstructive pulmonary disease, unspecified: Secondary | ICD-10-CM | POA: Diagnosis not present

## 2016-04-08 DIAGNOSIS — M150 Primary generalized (osteo)arthritis: Secondary | ICD-10-CM | POA: Diagnosis not present

## 2016-04-08 DIAGNOSIS — R69 Illness, unspecified: Secondary | ICD-10-CM | POA: Diagnosis not present

## 2016-04-08 DIAGNOSIS — Z7901 Long term (current) use of anticoagulants: Secondary | ICD-10-CM | POA: Diagnosis not present

## 2016-04-08 DIAGNOSIS — M6281 Muscle weakness (generalized): Secondary | ICD-10-CM | POA: Diagnosis not present

## 2016-04-08 DIAGNOSIS — R2689 Other abnormalities of gait and mobility: Secondary | ICD-10-CM | POA: Diagnosis not present

## 2016-04-08 DIAGNOSIS — Z9181 History of falling: Secondary | ICD-10-CM | POA: Diagnosis not present

## 2016-04-09 DIAGNOSIS — H43391 Other vitreous opacities, right eye: Secondary | ICD-10-CM | POA: Diagnosis not present

## 2016-04-09 DIAGNOSIS — H40053 Ocular hypertension, bilateral: Secondary | ICD-10-CM | POA: Diagnosis not present

## 2016-04-09 DIAGNOSIS — H47022 Hemorrhage in optic nerve sheath, left eye: Secondary | ICD-10-CM | POA: Diagnosis not present

## 2016-04-09 DIAGNOSIS — H401231 Low-tension glaucoma, bilateral, mild stage: Secondary | ICD-10-CM | POA: Diagnosis not present

## 2016-04-10 DIAGNOSIS — Z7901 Long term (current) use of anticoagulants: Secondary | ICD-10-CM | POA: Diagnosis not present

## 2016-04-10 DIAGNOSIS — J449 Chronic obstructive pulmonary disease, unspecified: Secondary | ICD-10-CM | POA: Diagnosis not present

## 2016-04-10 DIAGNOSIS — M549 Dorsalgia, unspecified: Secondary | ICD-10-CM | POA: Diagnosis not present

## 2016-04-10 DIAGNOSIS — M797 Fibromyalgia: Secondary | ICD-10-CM | POA: Diagnosis not present

## 2016-04-10 DIAGNOSIS — Z9181 History of falling: Secondary | ICD-10-CM | POA: Diagnosis not present

## 2016-04-10 DIAGNOSIS — R69 Illness, unspecified: Secondary | ICD-10-CM | POA: Diagnosis not present

## 2016-04-10 DIAGNOSIS — M150 Primary generalized (osteo)arthritis: Secondary | ICD-10-CM | POA: Diagnosis not present

## 2016-04-10 DIAGNOSIS — M6281 Muscle weakness (generalized): Secondary | ICD-10-CM | POA: Diagnosis not present

## 2016-04-10 DIAGNOSIS — R2689 Other abnormalities of gait and mobility: Secondary | ICD-10-CM | POA: Diagnosis not present

## 2016-04-11 DIAGNOSIS — Z7901 Long term (current) use of anticoagulants: Secondary | ICD-10-CM | POA: Diagnosis not present

## 2016-04-15 DIAGNOSIS — J449 Chronic obstructive pulmonary disease, unspecified: Secondary | ICD-10-CM | POA: Diagnosis not present

## 2016-04-15 DIAGNOSIS — M150 Primary generalized (osteo)arthritis: Secondary | ICD-10-CM | POA: Diagnosis not present

## 2016-04-15 DIAGNOSIS — Z7901 Long term (current) use of anticoagulants: Secondary | ICD-10-CM | POA: Diagnosis not present

## 2016-04-15 DIAGNOSIS — Z9181 History of falling: Secondary | ICD-10-CM | POA: Diagnosis not present

## 2016-04-15 DIAGNOSIS — M6281 Muscle weakness (generalized): Secondary | ICD-10-CM | POA: Diagnosis not present

## 2016-04-15 DIAGNOSIS — R2689 Other abnormalities of gait and mobility: Secondary | ICD-10-CM | POA: Diagnosis not present

## 2016-04-15 DIAGNOSIS — R69 Illness, unspecified: Secondary | ICD-10-CM | POA: Diagnosis not present

## 2016-04-15 DIAGNOSIS — M797 Fibromyalgia: Secondary | ICD-10-CM | POA: Diagnosis not present

## 2016-04-15 DIAGNOSIS — M549 Dorsalgia, unspecified: Secondary | ICD-10-CM | POA: Diagnosis not present

## 2016-04-17 DIAGNOSIS — M797 Fibromyalgia: Secondary | ICD-10-CM | POA: Diagnosis not present

## 2016-04-17 DIAGNOSIS — M6281 Muscle weakness (generalized): Secondary | ICD-10-CM | POA: Diagnosis not present

## 2016-04-17 DIAGNOSIS — M150 Primary generalized (osteo)arthritis: Secondary | ICD-10-CM | POA: Diagnosis not present

## 2016-04-17 DIAGNOSIS — R69 Illness, unspecified: Secondary | ICD-10-CM | POA: Diagnosis not present

## 2016-04-17 DIAGNOSIS — J449 Chronic obstructive pulmonary disease, unspecified: Secondary | ICD-10-CM | POA: Diagnosis not present

## 2016-04-17 DIAGNOSIS — Z7901 Long term (current) use of anticoagulants: Secondary | ICD-10-CM | POA: Diagnosis not present

## 2016-04-17 DIAGNOSIS — M549 Dorsalgia, unspecified: Secondary | ICD-10-CM | POA: Diagnosis not present

## 2016-04-17 DIAGNOSIS — Z9181 History of falling: Secondary | ICD-10-CM | POA: Diagnosis not present

## 2016-04-17 DIAGNOSIS — R2689 Other abnormalities of gait and mobility: Secondary | ICD-10-CM | POA: Diagnosis not present

## 2016-04-21 DIAGNOSIS — R2689 Other abnormalities of gait and mobility: Secondary | ICD-10-CM | POA: Diagnosis not present

## 2016-04-21 DIAGNOSIS — R69 Illness, unspecified: Secondary | ICD-10-CM | POA: Diagnosis not present

## 2016-04-21 DIAGNOSIS — M549 Dorsalgia, unspecified: Secondary | ICD-10-CM | POA: Diagnosis not present

## 2016-04-21 DIAGNOSIS — M6281 Muscle weakness (generalized): Secondary | ICD-10-CM | POA: Diagnosis not present

## 2016-04-21 DIAGNOSIS — J449 Chronic obstructive pulmonary disease, unspecified: Secondary | ICD-10-CM | POA: Diagnosis not present

## 2016-04-21 DIAGNOSIS — Z7901 Long term (current) use of anticoagulants: Secondary | ICD-10-CM | POA: Diagnosis not present

## 2016-04-21 DIAGNOSIS — M150 Primary generalized (osteo)arthritis: Secondary | ICD-10-CM | POA: Diagnosis not present

## 2016-04-21 DIAGNOSIS — Z9181 History of falling: Secondary | ICD-10-CM | POA: Diagnosis not present

## 2016-04-21 DIAGNOSIS — M797 Fibromyalgia: Secondary | ICD-10-CM | POA: Diagnosis not present

## 2016-04-23 DIAGNOSIS — J449 Chronic obstructive pulmonary disease, unspecified: Secondary | ICD-10-CM | POA: Diagnosis not present

## 2016-04-23 DIAGNOSIS — Z9181 History of falling: Secondary | ICD-10-CM | POA: Diagnosis not present

## 2016-04-23 DIAGNOSIS — Z7901 Long term (current) use of anticoagulants: Secondary | ICD-10-CM | POA: Diagnosis not present

## 2016-04-23 DIAGNOSIS — M549 Dorsalgia, unspecified: Secondary | ICD-10-CM | POA: Diagnosis not present

## 2016-04-23 DIAGNOSIS — M150 Primary generalized (osteo)arthritis: Secondary | ICD-10-CM | POA: Diagnosis not present

## 2016-04-23 DIAGNOSIS — M797 Fibromyalgia: Secondary | ICD-10-CM | POA: Diagnosis not present

## 2016-04-23 DIAGNOSIS — M6281 Muscle weakness (generalized): Secondary | ICD-10-CM | POA: Diagnosis not present

## 2016-04-23 DIAGNOSIS — R2689 Other abnormalities of gait and mobility: Secondary | ICD-10-CM | POA: Diagnosis not present

## 2016-04-23 DIAGNOSIS — R69 Illness, unspecified: Secondary | ICD-10-CM | POA: Diagnosis not present

## 2016-04-28 DIAGNOSIS — M6281 Muscle weakness (generalized): Secondary | ICD-10-CM | POA: Diagnosis not present

## 2016-04-28 DIAGNOSIS — M150 Primary generalized (osteo)arthritis: Secondary | ICD-10-CM | POA: Diagnosis not present

## 2016-04-28 DIAGNOSIS — J449 Chronic obstructive pulmonary disease, unspecified: Secondary | ICD-10-CM | POA: Diagnosis not present

## 2016-04-28 DIAGNOSIS — Z9181 History of falling: Secondary | ICD-10-CM | POA: Diagnosis not present

## 2016-04-28 DIAGNOSIS — M797 Fibromyalgia: Secondary | ICD-10-CM | POA: Diagnosis not present

## 2016-04-28 DIAGNOSIS — Z7901 Long term (current) use of anticoagulants: Secondary | ICD-10-CM | POA: Diagnosis not present

## 2016-04-28 DIAGNOSIS — R69 Illness, unspecified: Secondary | ICD-10-CM | POA: Diagnosis not present

## 2016-04-28 DIAGNOSIS — R2689 Other abnormalities of gait and mobility: Secondary | ICD-10-CM | POA: Diagnosis not present

## 2016-04-28 DIAGNOSIS — M549 Dorsalgia, unspecified: Secondary | ICD-10-CM | POA: Diagnosis not present

## 2016-05-01 DIAGNOSIS — Z7901 Long term (current) use of anticoagulants: Secondary | ICD-10-CM | POA: Diagnosis not present

## 2016-05-01 DIAGNOSIS — N390 Urinary tract infection, site not specified: Secondary | ICD-10-CM | POA: Diagnosis not present

## 2016-05-06 DIAGNOSIS — R69 Illness, unspecified: Secondary | ICD-10-CM | POA: Diagnosis not present

## 2016-05-06 DIAGNOSIS — J449 Chronic obstructive pulmonary disease, unspecified: Secondary | ICD-10-CM | POA: Diagnosis not present

## 2016-05-06 DIAGNOSIS — Z9181 History of falling: Secondary | ICD-10-CM | POA: Diagnosis not present

## 2016-05-06 DIAGNOSIS — M549 Dorsalgia, unspecified: Secondary | ICD-10-CM | POA: Diagnosis not present

## 2016-05-06 DIAGNOSIS — M150 Primary generalized (osteo)arthritis: Secondary | ICD-10-CM | POA: Diagnosis not present

## 2016-05-06 DIAGNOSIS — Z7901 Long term (current) use of anticoagulants: Secondary | ICD-10-CM | POA: Diagnosis not present

## 2016-05-06 DIAGNOSIS — R2689 Other abnormalities of gait and mobility: Secondary | ICD-10-CM | POA: Diagnosis not present

## 2016-05-06 DIAGNOSIS — M6281 Muscle weakness (generalized): Secondary | ICD-10-CM | POA: Diagnosis not present

## 2016-05-06 DIAGNOSIS — M797 Fibromyalgia: Secondary | ICD-10-CM | POA: Diagnosis not present

## 2016-05-07 DIAGNOSIS — R05 Cough: Secondary | ICD-10-CM | POA: Diagnosis not present

## 2016-05-07 DIAGNOSIS — M549 Dorsalgia, unspecified: Secondary | ICD-10-CM | POA: Diagnosis not present

## 2016-05-07 DIAGNOSIS — R110 Nausea: Secondary | ICD-10-CM | POA: Diagnosis not present

## 2016-05-07 DIAGNOSIS — N390 Urinary tract infection, site not specified: Secondary | ICD-10-CM | POA: Diagnosis not present

## 2016-05-07 DIAGNOSIS — R12 Heartburn: Secondary | ICD-10-CM | POA: Diagnosis not present

## 2016-05-07 DIAGNOSIS — M25551 Pain in right hip: Secondary | ICD-10-CM | POA: Diagnosis not present

## 2016-05-07 DIAGNOSIS — Z7901 Long term (current) use of anticoagulants: Secondary | ICD-10-CM | POA: Diagnosis not present

## 2016-05-07 DIAGNOSIS — R531 Weakness: Secondary | ICD-10-CM | POA: Diagnosis not present

## 2016-05-07 DIAGNOSIS — E538 Deficiency of other specified B group vitamins: Secondary | ICD-10-CM | POA: Diagnosis not present

## 2016-05-12 DIAGNOSIS — R2689 Other abnormalities of gait and mobility: Secondary | ICD-10-CM | POA: Diagnosis not present

## 2016-05-12 DIAGNOSIS — M150 Primary generalized (osteo)arthritis: Secondary | ICD-10-CM | POA: Diagnosis not present

## 2016-05-12 DIAGNOSIS — Z9181 History of falling: Secondary | ICD-10-CM | POA: Diagnosis not present

## 2016-05-12 DIAGNOSIS — J449 Chronic obstructive pulmonary disease, unspecified: Secondary | ICD-10-CM | POA: Diagnosis not present

## 2016-05-12 DIAGNOSIS — M797 Fibromyalgia: Secondary | ICD-10-CM | POA: Diagnosis not present

## 2016-05-12 DIAGNOSIS — Z7901 Long term (current) use of anticoagulants: Secondary | ICD-10-CM | POA: Diagnosis not present

## 2016-05-12 DIAGNOSIS — R69 Illness, unspecified: Secondary | ICD-10-CM | POA: Diagnosis not present

## 2016-05-12 DIAGNOSIS — M549 Dorsalgia, unspecified: Secondary | ICD-10-CM | POA: Diagnosis not present

## 2016-05-12 DIAGNOSIS — M6281 Muscle weakness (generalized): Secondary | ICD-10-CM | POA: Diagnosis not present

## 2016-05-13 DIAGNOSIS — M549 Dorsalgia, unspecified: Secondary | ICD-10-CM | POA: Diagnosis not present

## 2016-05-13 DIAGNOSIS — M546 Pain in thoracic spine: Secondary | ICD-10-CM | POA: Diagnosis not present

## 2016-05-13 DIAGNOSIS — M545 Low back pain: Secondary | ICD-10-CM | POA: Diagnosis not present

## 2016-05-13 DIAGNOSIS — R05 Cough: Secondary | ICD-10-CM | POA: Diagnosis not present

## 2016-05-13 DIAGNOSIS — M25551 Pain in right hip: Secondary | ICD-10-CM | POA: Diagnosis not present

## 2016-05-21 DIAGNOSIS — Z7901 Long term (current) use of anticoagulants: Secondary | ICD-10-CM | POA: Diagnosis not present

## 2016-05-28 DIAGNOSIS — M797 Fibromyalgia: Secondary | ICD-10-CM | POA: Diagnosis not present

## 2016-05-28 DIAGNOSIS — M150 Primary generalized (osteo)arthritis: Secondary | ICD-10-CM | POA: Diagnosis not present

## 2016-05-28 DIAGNOSIS — R2689 Other abnormalities of gait and mobility: Secondary | ICD-10-CM | POA: Diagnosis not present

## 2016-05-28 DIAGNOSIS — M549 Dorsalgia, unspecified: Secondary | ICD-10-CM | POA: Diagnosis not present

## 2016-05-28 DIAGNOSIS — R69 Illness, unspecified: Secondary | ICD-10-CM | POA: Diagnosis not present

## 2016-05-28 DIAGNOSIS — M6281 Muscle weakness (generalized): Secondary | ICD-10-CM | POA: Diagnosis not present

## 2016-05-28 DIAGNOSIS — J449 Chronic obstructive pulmonary disease, unspecified: Secondary | ICD-10-CM | POA: Diagnosis not present

## 2016-05-28 DIAGNOSIS — Z7901 Long term (current) use of anticoagulants: Secondary | ICD-10-CM | POA: Diagnosis not present

## 2016-05-28 DIAGNOSIS — Z9181 History of falling: Secondary | ICD-10-CM | POA: Diagnosis not present

## 2016-05-30 DIAGNOSIS — M6281 Muscle weakness (generalized): Secondary | ICD-10-CM | POA: Diagnosis not present

## 2016-05-30 DIAGNOSIS — M150 Primary generalized (osteo)arthritis: Secondary | ICD-10-CM | POA: Diagnosis not present

## 2016-05-30 DIAGNOSIS — R2689 Other abnormalities of gait and mobility: Secondary | ICD-10-CM | POA: Diagnosis not present

## 2016-05-30 DIAGNOSIS — M549 Dorsalgia, unspecified: Secondary | ICD-10-CM | POA: Diagnosis not present

## 2016-05-30 DIAGNOSIS — J449 Chronic obstructive pulmonary disease, unspecified: Secondary | ICD-10-CM | POA: Diagnosis not present

## 2016-05-30 DIAGNOSIS — M797 Fibromyalgia: Secondary | ICD-10-CM | POA: Diagnosis not present

## 2016-06-02 DIAGNOSIS — R69 Illness, unspecified: Secondary | ICD-10-CM | POA: Diagnosis not present

## 2016-06-02 DIAGNOSIS — J449 Chronic obstructive pulmonary disease, unspecified: Secondary | ICD-10-CM | POA: Diagnosis not present

## 2016-06-02 DIAGNOSIS — M549 Dorsalgia, unspecified: Secondary | ICD-10-CM | POA: Diagnosis not present

## 2016-06-02 DIAGNOSIS — M6281 Muscle weakness (generalized): Secondary | ICD-10-CM | POA: Diagnosis not present

## 2016-06-02 DIAGNOSIS — M797 Fibromyalgia: Secondary | ICD-10-CM | POA: Diagnosis not present

## 2016-06-02 DIAGNOSIS — Z9181 History of falling: Secondary | ICD-10-CM | POA: Diagnosis not present

## 2016-06-02 DIAGNOSIS — M150 Primary generalized (osteo)arthritis: Secondary | ICD-10-CM | POA: Diagnosis not present

## 2016-06-02 DIAGNOSIS — R2689 Other abnormalities of gait and mobility: Secondary | ICD-10-CM | POA: Diagnosis not present

## 2016-06-02 DIAGNOSIS — Z7901 Long term (current) use of anticoagulants: Secondary | ICD-10-CM | POA: Diagnosis not present

## 2016-06-04 DIAGNOSIS — Z7901 Long term (current) use of anticoagulants: Secondary | ICD-10-CM | POA: Diagnosis not present

## 2016-06-09 DIAGNOSIS — Z7901 Long term (current) use of anticoagulants: Secondary | ICD-10-CM | POA: Diagnosis not present

## 2016-06-09 DIAGNOSIS — M6281 Muscle weakness (generalized): Secondary | ICD-10-CM | POA: Diagnosis not present

## 2016-06-09 DIAGNOSIS — M797 Fibromyalgia: Secondary | ICD-10-CM | POA: Diagnosis not present

## 2016-06-09 DIAGNOSIS — Z9181 History of falling: Secondary | ICD-10-CM | POA: Diagnosis not present

## 2016-06-09 DIAGNOSIS — M150 Primary generalized (osteo)arthritis: Secondary | ICD-10-CM | POA: Diagnosis not present

## 2016-06-09 DIAGNOSIS — R69 Illness, unspecified: Secondary | ICD-10-CM | POA: Diagnosis not present

## 2016-06-09 DIAGNOSIS — M549 Dorsalgia, unspecified: Secondary | ICD-10-CM | POA: Diagnosis not present

## 2016-06-09 DIAGNOSIS — J449 Chronic obstructive pulmonary disease, unspecified: Secondary | ICD-10-CM | POA: Diagnosis not present

## 2016-06-09 DIAGNOSIS — R2689 Other abnormalities of gait and mobility: Secondary | ICD-10-CM | POA: Diagnosis not present

## 2016-06-16 DIAGNOSIS — Z7901 Long term (current) use of anticoagulants: Secondary | ICD-10-CM | POA: Diagnosis not present

## 2016-06-16 DIAGNOSIS — Z9181 History of falling: Secondary | ICD-10-CM | POA: Diagnosis not present

## 2016-06-16 DIAGNOSIS — J449 Chronic obstructive pulmonary disease, unspecified: Secondary | ICD-10-CM | POA: Diagnosis not present

## 2016-06-16 DIAGNOSIS — M549 Dorsalgia, unspecified: Secondary | ICD-10-CM | POA: Diagnosis not present

## 2016-06-16 DIAGNOSIS — M150 Primary generalized (osteo)arthritis: Secondary | ICD-10-CM | POA: Diagnosis not present

## 2016-06-16 DIAGNOSIS — M6281 Muscle weakness (generalized): Secondary | ICD-10-CM | POA: Diagnosis not present

## 2016-06-16 DIAGNOSIS — R69 Illness, unspecified: Secondary | ICD-10-CM | POA: Diagnosis not present

## 2016-06-16 DIAGNOSIS — M797 Fibromyalgia: Secondary | ICD-10-CM | POA: Diagnosis not present

## 2016-06-16 DIAGNOSIS — R2689 Other abnormalities of gait and mobility: Secondary | ICD-10-CM | POA: Diagnosis not present

## 2016-06-23 DIAGNOSIS — R69 Illness, unspecified: Secondary | ICD-10-CM | POA: Diagnosis not present

## 2016-06-23 DIAGNOSIS — M150 Primary generalized (osteo)arthritis: Secondary | ICD-10-CM | POA: Diagnosis not present

## 2016-06-23 DIAGNOSIS — M6281 Muscle weakness (generalized): Secondary | ICD-10-CM | POA: Diagnosis not present

## 2016-06-23 DIAGNOSIS — M797 Fibromyalgia: Secondary | ICD-10-CM | POA: Diagnosis not present

## 2016-06-23 DIAGNOSIS — J449 Chronic obstructive pulmonary disease, unspecified: Secondary | ICD-10-CM | POA: Diagnosis not present

## 2016-06-23 DIAGNOSIS — Z7901 Long term (current) use of anticoagulants: Secondary | ICD-10-CM | POA: Diagnosis not present

## 2016-06-23 DIAGNOSIS — R2689 Other abnormalities of gait and mobility: Secondary | ICD-10-CM | POA: Diagnosis not present

## 2016-06-23 DIAGNOSIS — Z9181 History of falling: Secondary | ICD-10-CM | POA: Diagnosis not present

## 2016-06-23 DIAGNOSIS — M549 Dorsalgia, unspecified: Secondary | ICD-10-CM | POA: Diagnosis not present

## 2016-06-30 DIAGNOSIS — M150 Primary generalized (osteo)arthritis: Secondary | ICD-10-CM | POA: Diagnosis not present

## 2016-06-30 DIAGNOSIS — M797 Fibromyalgia: Secondary | ICD-10-CM | POA: Diagnosis not present

## 2016-06-30 DIAGNOSIS — M549 Dorsalgia, unspecified: Secondary | ICD-10-CM | POA: Diagnosis not present

## 2016-06-30 DIAGNOSIS — J449 Chronic obstructive pulmonary disease, unspecified: Secondary | ICD-10-CM | POA: Diagnosis not present

## 2016-06-30 DIAGNOSIS — Z7901 Long term (current) use of anticoagulants: Secondary | ICD-10-CM | POA: Diagnosis not present

## 2016-06-30 DIAGNOSIS — Z9181 History of falling: Secondary | ICD-10-CM | POA: Diagnosis not present

## 2016-06-30 DIAGNOSIS — R2689 Other abnormalities of gait and mobility: Secondary | ICD-10-CM | POA: Diagnosis not present

## 2016-06-30 DIAGNOSIS — R69 Illness, unspecified: Secondary | ICD-10-CM | POA: Diagnosis not present

## 2016-06-30 DIAGNOSIS — M6281 Muscle weakness (generalized): Secondary | ICD-10-CM | POA: Diagnosis not present

## 2016-07-07 DIAGNOSIS — R2689 Other abnormalities of gait and mobility: Secondary | ICD-10-CM | POA: Diagnosis not present

## 2016-07-07 DIAGNOSIS — M150 Primary generalized (osteo)arthritis: Secondary | ICD-10-CM | POA: Diagnosis not present

## 2016-07-07 DIAGNOSIS — J449 Chronic obstructive pulmonary disease, unspecified: Secondary | ICD-10-CM | POA: Diagnosis not present

## 2016-07-07 DIAGNOSIS — M549 Dorsalgia, unspecified: Secondary | ICD-10-CM | POA: Diagnosis not present

## 2016-07-07 DIAGNOSIS — Z7901 Long term (current) use of anticoagulants: Secondary | ICD-10-CM | POA: Diagnosis not present

## 2016-07-07 DIAGNOSIS — M797 Fibromyalgia: Secondary | ICD-10-CM | POA: Diagnosis not present

## 2016-07-07 DIAGNOSIS — R69 Illness, unspecified: Secondary | ICD-10-CM | POA: Diagnosis not present

## 2016-07-07 DIAGNOSIS — Z9181 History of falling: Secondary | ICD-10-CM | POA: Diagnosis not present

## 2016-07-07 DIAGNOSIS — M6281 Muscle weakness (generalized): Secondary | ICD-10-CM | POA: Diagnosis not present

## 2016-07-09 DIAGNOSIS — Z7901 Long term (current) use of anticoagulants: Secondary | ICD-10-CM | POA: Diagnosis not present

## 2016-07-14 DIAGNOSIS — M549 Dorsalgia, unspecified: Secondary | ICD-10-CM | POA: Diagnosis not present

## 2016-07-14 DIAGNOSIS — M6281 Muscle weakness (generalized): Secondary | ICD-10-CM | POA: Diagnosis not present

## 2016-07-14 DIAGNOSIS — R2689 Other abnormalities of gait and mobility: Secondary | ICD-10-CM | POA: Diagnosis not present

## 2016-07-14 DIAGNOSIS — R69 Illness, unspecified: Secondary | ICD-10-CM | POA: Diagnosis not present

## 2016-07-14 DIAGNOSIS — Z7901 Long term (current) use of anticoagulants: Secondary | ICD-10-CM | POA: Diagnosis not present

## 2016-07-14 DIAGNOSIS — M797 Fibromyalgia: Secondary | ICD-10-CM | POA: Diagnosis not present

## 2016-07-14 DIAGNOSIS — J449 Chronic obstructive pulmonary disease, unspecified: Secondary | ICD-10-CM | POA: Diagnosis not present

## 2016-07-14 DIAGNOSIS — M150 Primary generalized (osteo)arthritis: Secondary | ICD-10-CM | POA: Diagnosis not present

## 2016-07-14 DIAGNOSIS — Z9181 History of falling: Secondary | ICD-10-CM | POA: Diagnosis not present

## 2016-07-24 DIAGNOSIS — M6281 Muscle weakness (generalized): Secondary | ICD-10-CM | POA: Diagnosis not present

## 2016-07-24 DIAGNOSIS — M549 Dorsalgia, unspecified: Secondary | ICD-10-CM | POA: Diagnosis not present

## 2016-07-24 DIAGNOSIS — M797 Fibromyalgia: Secondary | ICD-10-CM | POA: Diagnosis not present

## 2016-07-24 DIAGNOSIS — R2689 Other abnormalities of gait and mobility: Secondary | ICD-10-CM | POA: Diagnosis not present

## 2016-07-24 DIAGNOSIS — J449 Chronic obstructive pulmonary disease, unspecified: Secondary | ICD-10-CM | POA: Diagnosis not present

## 2016-07-24 DIAGNOSIS — Z7901 Long term (current) use of anticoagulants: Secondary | ICD-10-CM | POA: Diagnosis not present

## 2016-07-24 DIAGNOSIS — R69 Illness, unspecified: Secondary | ICD-10-CM | POA: Diagnosis not present

## 2016-07-24 DIAGNOSIS — M150 Primary generalized (osteo)arthritis: Secondary | ICD-10-CM | POA: Diagnosis not present

## 2016-07-24 DIAGNOSIS — Z9181 History of falling: Secondary | ICD-10-CM | POA: Diagnosis not present

## 2016-07-25 DIAGNOSIS — H9201 Otalgia, right ear: Secondary | ICD-10-CM | POA: Diagnosis not present

## 2016-07-29 DIAGNOSIS — Z9181 History of falling: Secondary | ICD-10-CM | POA: Diagnosis not present

## 2016-07-29 DIAGNOSIS — R69 Illness, unspecified: Secondary | ICD-10-CM | POA: Diagnosis not present

## 2016-07-29 DIAGNOSIS — M150 Primary generalized (osteo)arthritis: Secondary | ICD-10-CM | POA: Diagnosis not present

## 2016-07-29 DIAGNOSIS — M549 Dorsalgia, unspecified: Secondary | ICD-10-CM | POA: Diagnosis not present

## 2016-07-29 DIAGNOSIS — R2689 Other abnormalities of gait and mobility: Secondary | ICD-10-CM | POA: Diagnosis not present

## 2016-07-29 DIAGNOSIS — M6281 Muscle weakness (generalized): Secondary | ICD-10-CM | POA: Diagnosis not present

## 2016-07-29 DIAGNOSIS — J449 Chronic obstructive pulmonary disease, unspecified: Secondary | ICD-10-CM | POA: Diagnosis not present

## 2016-07-29 DIAGNOSIS — Z7901 Long term (current) use of anticoagulants: Secondary | ICD-10-CM | POA: Diagnosis not present

## 2016-07-29 DIAGNOSIS — M797 Fibromyalgia: Secondary | ICD-10-CM | POA: Diagnosis not present

## 2016-07-30 DIAGNOSIS — Z7901 Long term (current) use of anticoagulants: Secondary | ICD-10-CM | POA: Diagnosis not present

## 2016-08-04 DIAGNOSIS — J449 Chronic obstructive pulmonary disease, unspecified: Secondary | ICD-10-CM | POA: Diagnosis not present

## 2016-08-04 DIAGNOSIS — Z7901 Long term (current) use of anticoagulants: Secondary | ICD-10-CM | POA: Diagnosis not present

## 2016-08-04 DIAGNOSIS — R2689 Other abnormalities of gait and mobility: Secondary | ICD-10-CM | POA: Diagnosis not present

## 2016-08-04 DIAGNOSIS — M797 Fibromyalgia: Secondary | ICD-10-CM | POA: Diagnosis not present

## 2016-08-04 DIAGNOSIS — M150 Primary generalized (osteo)arthritis: Secondary | ICD-10-CM | POA: Diagnosis not present

## 2016-08-04 DIAGNOSIS — R69 Illness, unspecified: Secondary | ICD-10-CM | POA: Diagnosis not present

## 2016-08-04 DIAGNOSIS — Z9181 History of falling: Secondary | ICD-10-CM | POA: Diagnosis not present

## 2016-08-04 DIAGNOSIS — M549 Dorsalgia, unspecified: Secondary | ICD-10-CM | POA: Diagnosis not present

## 2016-08-04 DIAGNOSIS — M6281 Muscle weakness (generalized): Secondary | ICD-10-CM | POA: Diagnosis not present

## 2016-08-19 DIAGNOSIS — Z9181 History of falling: Secondary | ICD-10-CM | POA: Diagnosis not present

## 2016-08-19 DIAGNOSIS — M6281 Muscle weakness (generalized): Secondary | ICD-10-CM | POA: Diagnosis not present

## 2016-08-19 DIAGNOSIS — M797 Fibromyalgia: Secondary | ICD-10-CM | POA: Diagnosis not present

## 2016-08-19 DIAGNOSIS — Z7901 Long term (current) use of anticoagulants: Secondary | ICD-10-CM | POA: Diagnosis not present

## 2016-08-19 DIAGNOSIS — M150 Primary generalized (osteo)arthritis: Secondary | ICD-10-CM | POA: Diagnosis not present

## 2016-08-19 DIAGNOSIS — J449 Chronic obstructive pulmonary disease, unspecified: Secondary | ICD-10-CM | POA: Diagnosis not present

## 2016-08-19 DIAGNOSIS — R69 Illness, unspecified: Secondary | ICD-10-CM | POA: Diagnosis not present

## 2016-08-19 DIAGNOSIS — M549 Dorsalgia, unspecified: Secondary | ICD-10-CM | POA: Diagnosis not present

## 2016-08-19 DIAGNOSIS — R2689 Other abnormalities of gait and mobility: Secondary | ICD-10-CM | POA: Diagnosis not present

## 2016-08-25 DIAGNOSIS — M797 Fibromyalgia: Secondary | ICD-10-CM | POA: Diagnosis not present

## 2016-08-25 DIAGNOSIS — M150 Primary generalized (osteo)arthritis: Secondary | ICD-10-CM | POA: Diagnosis not present

## 2016-08-25 DIAGNOSIS — J449 Chronic obstructive pulmonary disease, unspecified: Secondary | ICD-10-CM | POA: Diagnosis not present

## 2016-08-25 DIAGNOSIS — Z7901 Long term (current) use of anticoagulants: Secondary | ICD-10-CM | POA: Diagnosis not present

## 2016-08-25 DIAGNOSIS — M549 Dorsalgia, unspecified: Secondary | ICD-10-CM | POA: Diagnosis not present

## 2016-08-25 DIAGNOSIS — R69 Illness, unspecified: Secondary | ICD-10-CM | POA: Diagnosis not present

## 2016-08-25 DIAGNOSIS — R2689 Other abnormalities of gait and mobility: Secondary | ICD-10-CM | POA: Diagnosis not present

## 2016-08-25 DIAGNOSIS — M6281 Muscle weakness (generalized): Secondary | ICD-10-CM | POA: Diagnosis not present

## 2016-08-25 DIAGNOSIS — Z9181 History of falling: Secondary | ICD-10-CM | POA: Diagnosis not present

## 2016-09-03 DIAGNOSIS — H5712 Ocular pain, left eye: Secondary | ICD-10-CM | POA: Diagnosis not present

## 2016-09-03 DIAGNOSIS — H401231 Low-tension glaucoma, bilateral, mild stage: Secondary | ICD-10-CM | POA: Diagnosis not present

## 2016-09-03 DIAGNOSIS — H04123 Dry eye syndrome of bilateral lacrimal glands: Secondary | ICD-10-CM | POA: Diagnosis not present

## 2016-09-03 DIAGNOSIS — H01003 Unspecified blepharitis right eye, unspecified eyelid: Secondary | ICD-10-CM | POA: Diagnosis not present

## 2016-09-04 DIAGNOSIS — M6281 Muscle weakness (generalized): Secondary | ICD-10-CM | POA: Diagnosis not present

## 2016-09-04 DIAGNOSIS — J449 Chronic obstructive pulmonary disease, unspecified: Secondary | ICD-10-CM | POA: Diagnosis not present

## 2016-09-04 DIAGNOSIS — R2689 Other abnormalities of gait and mobility: Secondary | ICD-10-CM | POA: Diagnosis not present

## 2016-09-04 DIAGNOSIS — R69 Illness, unspecified: Secondary | ICD-10-CM | POA: Diagnosis not present

## 2016-09-04 DIAGNOSIS — M797 Fibromyalgia: Secondary | ICD-10-CM | POA: Diagnosis not present

## 2016-09-04 DIAGNOSIS — Z7901 Long term (current) use of anticoagulants: Secondary | ICD-10-CM | POA: Diagnosis not present

## 2016-09-04 DIAGNOSIS — M150 Primary generalized (osteo)arthritis: Secondary | ICD-10-CM | POA: Diagnosis not present

## 2016-09-04 DIAGNOSIS — Z9181 History of falling: Secondary | ICD-10-CM | POA: Diagnosis not present

## 2016-09-04 DIAGNOSIS — M549 Dorsalgia, unspecified: Secondary | ICD-10-CM | POA: Diagnosis not present

## 2016-09-09 DIAGNOSIS — M549 Dorsalgia, unspecified: Secondary | ICD-10-CM | POA: Diagnosis not present

## 2016-09-09 DIAGNOSIS — M797 Fibromyalgia: Secondary | ICD-10-CM | POA: Diagnosis not present

## 2016-09-09 DIAGNOSIS — R69 Illness, unspecified: Secondary | ICD-10-CM | POA: Diagnosis not present

## 2016-09-09 DIAGNOSIS — J449 Chronic obstructive pulmonary disease, unspecified: Secondary | ICD-10-CM | POA: Diagnosis not present

## 2016-09-09 DIAGNOSIS — Z9181 History of falling: Secondary | ICD-10-CM | POA: Diagnosis not present

## 2016-09-09 DIAGNOSIS — Z7901 Long term (current) use of anticoagulants: Secondary | ICD-10-CM | POA: Diagnosis not present

## 2016-09-09 DIAGNOSIS — M150 Primary generalized (osteo)arthritis: Secondary | ICD-10-CM | POA: Diagnosis not present

## 2016-09-09 DIAGNOSIS — R2689 Other abnormalities of gait and mobility: Secondary | ICD-10-CM | POA: Diagnosis not present

## 2016-09-09 DIAGNOSIS — M6281 Muscle weakness (generalized): Secondary | ICD-10-CM | POA: Diagnosis not present

## 2016-09-10 DIAGNOSIS — Z7901 Long term (current) use of anticoagulants: Secondary | ICD-10-CM | POA: Diagnosis not present

## 2016-09-10 DIAGNOSIS — G3184 Mild cognitive impairment, so stated: Secondary | ICD-10-CM | POA: Diagnosis not present

## 2016-09-10 DIAGNOSIS — Z79899 Other long term (current) drug therapy: Secondary | ICD-10-CM | POA: Diagnosis not present

## 2016-09-10 DIAGNOSIS — J301 Allergic rhinitis due to pollen: Secondary | ICD-10-CM | POA: Diagnosis not present

## 2016-09-10 DIAGNOSIS — G4700 Insomnia, unspecified: Secondary | ICD-10-CM | POA: Diagnosis not present

## 2016-09-10 DIAGNOSIS — R7989 Other specified abnormal findings of blood chemistry: Secondary | ICD-10-CM | POA: Diagnosis not present

## 2016-09-15 DIAGNOSIS — Z7901 Long term (current) use of anticoagulants: Secondary | ICD-10-CM | POA: Diagnosis not present

## 2016-09-15 DIAGNOSIS — R2689 Other abnormalities of gait and mobility: Secondary | ICD-10-CM | POA: Diagnosis not present

## 2016-09-15 DIAGNOSIS — Z9181 History of falling: Secondary | ICD-10-CM | POA: Diagnosis not present

## 2016-09-15 DIAGNOSIS — M549 Dorsalgia, unspecified: Secondary | ICD-10-CM | POA: Diagnosis not present

## 2016-09-15 DIAGNOSIS — M6281 Muscle weakness (generalized): Secondary | ICD-10-CM | POA: Diagnosis not present

## 2016-09-15 DIAGNOSIS — M797 Fibromyalgia: Secondary | ICD-10-CM | POA: Diagnosis not present

## 2016-09-15 DIAGNOSIS — R69 Illness, unspecified: Secondary | ICD-10-CM | POA: Diagnosis not present

## 2016-09-15 DIAGNOSIS — J449 Chronic obstructive pulmonary disease, unspecified: Secondary | ICD-10-CM | POA: Diagnosis not present

## 2016-09-15 DIAGNOSIS — M150 Primary generalized (osteo)arthritis: Secondary | ICD-10-CM | POA: Diagnosis not present

## 2016-09-22 DIAGNOSIS — R69 Illness, unspecified: Secondary | ICD-10-CM | POA: Diagnosis not present

## 2016-09-22 DIAGNOSIS — Z7901 Long term (current) use of anticoagulants: Secondary | ICD-10-CM | POA: Diagnosis not present

## 2016-09-22 DIAGNOSIS — R2689 Other abnormalities of gait and mobility: Secondary | ICD-10-CM | POA: Diagnosis not present

## 2016-09-22 DIAGNOSIS — M6281 Muscle weakness (generalized): Secondary | ICD-10-CM | POA: Diagnosis not present

## 2016-09-22 DIAGNOSIS — M797 Fibromyalgia: Secondary | ICD-10-CM | POA: Diagnosis not present

## 2016-09-22 DIAGNOSIS — M549 Dorsalgia, unspecified: Secondary | ICD-10-CM | POA: Diagnosis not present

## 2016-09-22 DIAGNOSIS — M150 Primary generalized (osteo)arthritis: Secondary | ICD-10-CM | POA: Diagnosis not present

## 2016-09-22 DIAGNOSIS — Z9181 History of falling: Secondary | ICD-10-CM | POA: Diagnosis not present

## 2016-09-22 DIAGNOSIS — J449 Chronic obstructive pulmonary disease, unspecified: Secondary | ICD-10-CM | POA: Diagnosis not present

## 2016-09-27 DIAGNOSIS — J449 Chronic obstructive pulmonary disease, unspecified: Secondary | ICD-10-CM | POA: Diagnosis not present

## 2016-09-27 DIAGNOSIS — R2689 Other abnormalities of gait and mobility: Secondary | ICD-10-CM | POA: Diagnosis not present

## 2016-09-27 DIAGNOSIS — M549 Dorsalgia, unspecified: Secondary | ICD-10-CM | POA: Diagnosis not present

## 2016-09-27 DIAGNOSIS — M6281 Muscle weakness (generalized): Secondary | ICD-10-CM | POA: Diagnosis not present

## 2016-09-27 DIAGNOSIS — M797 Fibromyalgia: Secondary | ICD-10-CM | POA: Diagnosis not present

## 2016-09-29 DIAGNOSIS — R69 Illness, unspecified: Secondary | ICD-10-CM | POA: Diagnosis not present

## 2016-09-29 DIAGNOSIS — M6281 Muscle weakness (generalized): Secondary | ICD-10-CM | POA: Diagnosis not present

## 2016-09-29 DIAGNOSIS — Z9181 History of falling: Secondary | ICD-10-CM | POA: Diagnosis not present

## 2016-09-29 DIAGNOSIS — M797 Fibromyalgia: Secondary | ICD-10-CM | POA: Diagnosis not present

## 2016-09-29 DIAGNOSIS — J449 Chronic obstructive pulmonary disease, unspecified: Secondary | ICD-10-CM | POA: Diagnosis not present

## 2016-09-29 DIAGNOSIS — R2689 Other abnormalities of gait and mobility: Secondary | ICD-10-CM | POA: Diagnosis not present

## 2016-09-29 DIAGNOSIS — M150 Primary generalized (osteo)arthritis: Secondary | ICD-10-CM | POA: Diagnosis not present

## 2016-09-29 DIAGNOSIS — Z7901 Long term (current) use of anticoagulants: Secondary | ICD-10-CM | POA: Diagnosis not present

## 2016-09-29 DIAGNOSIS — M549 Dorsalgia, unspecified: Secondary | ICD-10-CM | POA: Diagnosis not present

## 2016-10-06 DIAGNOSIS — Z7901 Long term (current) use of anticoagulants: Secondary | ICD-10-CM | POA: Diagnosis not present

## 2016-10-06 DIAGNOSIS — M6281 Muscle weakness (generalized): Secondary | ICD-10-CM | POA: Diagnosis not present

## 2016-10-06 DIAGNOSIS — M150 Primary generalized (osteo)arthritis: Secondary | ICD-10-CM | POA: Diagnosis not present

## 2016-10-06 DIAGNOSIS — M797 Fibromyalgia: Secondary | ICD-10-CM | POA: Diagnosis not present

## 2016-10-06 DIAGNOSIS — R2689 Other abnormalities of gait and mobility: Secondary | ICD-10-CM | POA: Diagnosis not present

## 2016-10-06 DIAGNOSIS — J449 Chronic obstructive pulmonary disease, unspecified: Secondary | ICD-10-CM | POA: Diagnosis not present

## 2016-10-06 DIAGNOSIS — Z9181 History of falling: Secondary | ICD-10-CM | POA: Diagnosis not present

## 2016-10-06 DIAGNOSIS — R69 Illness, unspecified: Secondary | ICD-10-CM | POA: Diagnosis not present

## 2016-10-06 DIAGNOSIS — M549 Dorsalgia, unspecified: Secondary | ICD-10-CM | POA: Diagnosis not present

## 2016-10-13 DIAGNOSIS — Z7901 Long term (current) use of anticoagulants: Secondary | ICD-10-CM | POA: Diagnosis not present

## 2016-10-15 DIAGNOSIS — M797 Fibromyalgia: Secondary | ICD-10-CM | POA: Diagnosis not present

## 2016-10-15 DIAGNOSIS — M150 Primary generalized (osteo)arthritis: Secondary | ICD-10-CM | POA: Diagnosis not present

## 2016-10-15 DIAGNOSIS — M6281 Muscle weakness (generalized): Secondary | ICD-10-CM | POA: Diagnosis not present

## 2016-10-15 DIAGNOSIS — R69 Illness, unspecified: Secondary | ICD-10-CM | POA: Diagnosis not present

## 2016-10-15 DIAGNOSIS — M549 Dorsalgia, unspecified: Secondary | ICD-10-CM | POA: Diagnosis not present

## 2016-10-15 DIAGNOSIS — R2689 Other abnormalities of gait and mobility: Secondary | ICD-10-CM | POA: Diagnosis not present

## 2016-10-15 DIAGNOSIS — Z9181 History of falling: Secondary | ICD-10-CM | POA: Diagnosis not present

## 2016-10-15 DIAGNOSIS — Z7901 Long term (current) use of anticoagulants: Secondary | ICD-10-CM | POA: Diagnosis not present

## 2016-10-15 DIAGNOSIS — J449 Chronic obstructive pulmonary disease, unspecified: Secondary | ICD-10-CM | POA: Diagnosis not present

## 2016-10-20 DIAGNOSIS — Z7901 Long term (current) use of anticoagulants: Secondary | ICD-10-CM | POA: Diagnosis not present

## 2016-10-21 DIAGNOSIS — M797 Fibromyalgia: Secondary | ICD-10-CM | POA: Diagnosis not present

## 2016-10-21 DIAGNOSIS — M150 Primary generalized (osteo)arthritis: Secondary | ICD-10-CM | POA: Diagnosis not present

## 2016-10-21 DIAGNOSIS — R2689 Other abnormalities of gait and mobility: Secondary | ICD-10-CM | POA: Diagnosis not present

## 2016-10-21 DIAGNOSIS — J449 Chronic obstructive pulmonary disease, unspecified: Secondary | ICD-10-CM | POA: Diagnosis not present

## 2016-10-21 DIAGNOSIS — M549 Dorsalgia, unspecified: Secondary | ICD-10-CM | POA: Diagnosis not present

## 2016-10-21 DIAGNOSIS — Z7901 Long term (current) use of anticoagulants: Secondary | ICD-10-CM | POA: Diagnosis not present

## 2016-10-21 DIAGNOSIS — Z9181 History of falling: Secondary | ICD-10-CM | POA: Diagnosis not present

## 2016-10-21 DIAGNOSIS — M6281 Muscle weakness (generalized): Secondary | ICD-10-CM | POA: Diagnosis not present

## 2016-10-21 DIAGNOSIS — R69 Illness, unspecified: Secondary | ICD-10-CM | POA: Diagnosis not present

## 2016-10-29 DIAGNOSIS — Z7901 Long term (current) use of anticoagulants: Secondary | ICD-10-CM | POA: Diagnosis not present

## 2016-11-13 DIAGNOSIS — Z7901 Long term (current) use of anticoagulants: Secondary | ICD-10-CM | POA: Diagnosis not present

## 2016-11-25 DIAGNOSIS — M25559 Pain in unspecified hip: Secondary | ICD-10-CM | POA: Diagnosis not present

## 2016-11-25 DIAGNOSIS — Z8673 Personal history of transient ischemic attack (TIA), and cerebral infarction without residual deficits: Secondary | ICD-10-CM | POA: Diagnosis not present

## 2016-11-25 DIAGNOSIS — W01198A Fall on same level from slipping, tripping and stumbling with subsequent striking against other object, initial encounter: Secondary | ICD-10-CM | POA: Diagnosis not present

## 2016-11-25 DIAGNOSIS — M25511 Pain in right shoulder: Secondary | ICD-10-CM | POA: Diagnosis not present

## 2016-11-25 DIAGNOSIS — Z880 Allergy status to penicillin: Secondary | ICD-10-CM | POA: Diagnosis not present

## 2016-11-25 DIAGNOSIS — T148XXA Other injury of unspecified body region, initial encounter: Secondary | ICD-10-CM | POA: Diagnosis not present

## 2016-11-25 DIAGNOSIS — R69 Illness, unspecified: Secondary | ICD-10-CM | POA: Diagnosis not present

## 2016-11-25 DIAGNOSIS — M797 Fibromyalgia: Secondary | ICD-10-CM | POA: Diagnosis not present

## 2016-11-25 DIAGNOSIS — M542 Cervicalgia: Secondary | ICD-10-CM | POA: Diagnosis not present

## 2016-11-25 DIAGNOSIS — M25551 Pain in right hip: Secondary | ICD-10-CM | POA: Diagnosis not present

## 2016-11-25 DIAGNOSIS — S4991XA Unspecified injury of right shoulder and upper arm, initial encounter: Secondary | ICD-10-CM | POA: Diagnosis not present

## 2016-11-25 DIAGNOSIS — S79911A Unspecified injury of right hip, initial encounter: Secondary | ICD-10-CM | POA: Diagnosis not present

## 2016-12-03 DIAGNOSIS — G4700 Insomnia, unspecified: Secondary | ICD-10-CM | POA: Diagnosis not present

## 2016-12-03 DIAGNOSIS — R69 Illness, unspecified: Secondary | ICD-10-CM | POA: Diagnosis not present

## 2016-12-03 DIAGNOSIS — E538 Deficiency of other specified B group vitamins: Secondary | ICD-10-CM | POA: Diagnosis not present

## 2016-12-03 DIAGNOSIS — Z7901 Long term (current) use of anticoagulants: Secondary | ICD-10-CM | POA: Diagnosis not present

## 2016-12-03 DIAGNOSIS — R443 Hallucinations, unspecified: Secondary | ICD-10-CM | POA: Diagnosis not present

## 2016-12-03 DIAGNOSIS — Z86711 Personal history of pulmonary embolism: Secondary | ICD-10-CM | POA: Diagnosis not present

## 2016-12-06 ENCOUNTER — Emergency Department (HOSPITAL_COMMUNITY)
Admission: EM | Admit: 2016-12-06 | Discharge: 2016-12-09 | Disposition: A | Discharge status: Home / Self Care | Payer: Medicare HMO | Attending: Emergency Medicine | Admitting: Emergency Medicine

## 2016-12-06 ENCOUNTER — Emergency Department (HOSPITAL_COMMUNITY): Payer: Medicare HMO

## 2016-12-06 ENCOUNTER — Encounter (HOSPITAL_COMMUNITY): Payer: Self-pay | Admitting: Emergency Medicine

## 2016-12-06 DIAGNOSIS — N3000 Acute cystitis without hematuria: Secondary | ICD-10-CM | POA: Diagnosis not present

## 2016-12-06 DIAGNOSIS — R69 Illness, unspecified: Secondary | ICD-10-CM | POA: Diagnosis not present

## 2016-12-06 DIAGNOSIS — Z79899 Other long term (current) drug therapy: Secondary | ICD-10-CM | POA: Insufficient documentation

## 2016-12-06 DIAGNOSIS — R410 Disorientation, unspecified: Secondary | ICD-10-CM | POA: Diagnosis not present

## 2016-12-06 DIAGNOSIS — Z7901 Long term (current) use of anticoagulants: Secondary | ICD-10-CM | POA: Insufficient documentation

## 2016-12-06 DIAGNOSIS — F919 Conduct disorder, unspecified: Secondary | ICD-10-CM | POA: Insufficient documentation

## 2016-12-06 DIAGNOSIS — R109 Unspecified abdominal pain: Secondary | ICD-10-CM | POA: Diagnosis not present

## 2016-12-06 DIAGNOSIS — R51 Headache: Secondary | ICD-10-CM | POA: Diagnosis not present

## 2016-12-06 DIAGNOSIS — N390 Urinary tract infection, site not specified: Secondary | ICD-10-CM

## 2016-12-06 DIAGNOSIS — Z87440 Personal history of urinary (tract) infections: Secondary | ICD-10-CM | POA: Diagnosis not present

## 2016-12-06 DIAGNOSIS — R442 Other hallucinations: Secondary | ICD-10-CM | POA: Insufficient documentation

## 2016-12-06 DIAGNOSIS — E119 Type 2 diabetes mellitus without complications: Secondary | ICD-10-CM | POA: Insufficient documentation

## 2016-12-06 DIAGNOSIS — F22 Delusional disorders: Secondary | ICD-10-CM | POA: Diagnosis not present

## 2016-12-06 DIAGNOSIS — Z8673 Personal history of transient ischemic attack (TIA), and cerebral infarction without residual deficits: Secondary | ICD-10-CM | POA: Insufficient documentation

## 2016-12-06 DIAGNOSIS — J449 Chronic obstructive pulmonary disease, unspecified: Secondary | ICD-10-CM | POA: Diagnosis not present

## 2016-12-06 DIAGNOSIS — R05 Cough: Secondary | ICD-10-CM | POA: Diagnosis not present

## 2016-12-06 LAB — URINALYSIS, ROUTINE W REFLEX MICROSCOPIC
Bilirubin Urine: NEGATIVE
GLUCOSE, UA: NEGATIVE mg/dL
Hgb urine dipstick: NEGATIVE
Ketones, ur: NEGATIVE mg/dL
NITRITE: NEGATIVE
PH: 5 (ref 5.0–8.0)
Protein, ur: 30 mg/dL — AB
SPECIFIC GRAVITY, URINE: 1.02 (ref 1.005–1.030)

## 2016-12-06 LAB — CBC
HCT: 39.9 % (ref 36.0–46.0)
Hemoglobin: 12.2 g/dL (ref 12.0–15.0)
MCH: 29 pg (ref 26.0–34.0)
MCHC: 30.6 g/dL (ref 30.0–36.0)
MCV: 94.8 fL (ref 78.0–100.0)
PLATELETS: 287 10*3/uL (ref 150–400)
RBC: 4.21 MIL/uL (ref 3.87–5.11)
RDW: 12.8 % (ref 11.5–15.5)
WBC: 8.4 10*3/uL (ref 4.0–10.5)

## 2016-12-06 LAB — COMPREHENSIVE METABOLIC PANEL
ALT: 35 U/L (ref 14–54)
AST: 28 U/L (ref 15–41)
Albumin: 3.8 g/dL (ref 3.5–5.0)
Alkaline Phosphatase: 69 U/L (ref 38–126)
Anion gap: 8 (ref 5–15)
BILIRUBIN TOTAL: 0.7 mg/dL (ref 0.3–1.2)
BUN: 18 mg/dL (ref 6–20)
CO2: 27 mmol/L (ref 22–32)
Calcium: 10.3 mg/dL (ref 8.9–10.3)
Chloride: 103 mmol/L (ref 101–111)
Creatinine, Ser: 0.71 mg/dL (ref 0.44–1.00)
Glucose, Bld: 118 mg/dL — ABNORMAL HIGH (ref 65–99)
POTASSIUM: 3.6 mmol/L (ref 3.5–5.1)
Sodium: 138 mmol/L (ref 135–145)
TOTAL PROTEIN: 6.3 g/dL — AB (ref 6.5–8.1)

## 2016-12-06 MED ORDER — SODIUM CHLORIDE 0.9 % IV BOLUS (SEPSIS)
500.0000 mL | Freq: Once | INTRAVENOUS | Status: AC
  Administered 2016-12-07: 500 mL via INTRAVENOUS

## 2016-12-06 MED ORDER — DEXTROSE 5 % IV SOLN
1.0000 g | Freq: Once | INTRAVENOUS | Status: AC
  Administered 2016-12-07: 1 g via INTRAVENOUS
  Filled 2016-12-06: qty 10

## 2016-12-06 MED ORDER — ONDANSETRON HCL 4 MG/2ML IJ SOLN
4.0000 mg | Freq: Once | INTRAMUSCULAR | Status: AC
  Administered 2016-12-07: 4 mg via INTRAVENOUS
  Filled 2016-12-06: qty 2

## 2016-12-06 NOTE — ED Provider Notes (Addendum)
TIME SEEN: 11:29 PM  CHIEF COMPLAINT: Multiple complaints  HPI: Patient is an 83 year old female with history of COPD, diabetes, atrial fibrillation on Coumadin, recurrent urinary tract infections, depression who presents to the emergency department with multiple complaints. She comes in with her daughter who lives with her. Daughter reports over the past 3 weeks patient seems to be more confused than normal. She states that the patient has been paranoid and thinks that people are trying to hurt her and her family. Daughter states that the patient has been seeing things on television, for example she saw family members on the doctor on his television show and states they are trying to plot to hurt her. Patient denies SI, hallucinations, HI. Daughter reports that they saw her primary care physician on October 10 who started her on low-dose olanzapine 2.5 mg daily. No other medication changes other than decreasing her amitriptyline to 1 tablet at night.  Daughter denies history of previous psychiatric admission.  Patient reports that she is here because she has had 4 falls in the past few weeks. Her last fall was October 3. She was seen that time at [facility] and had x-rays of her back. Has known history of lumbar compression fractures. Takes half a tablet of hydrocodone 5/325 mg at home when needed. Daughter states that she is not sure if she hit her head at that time and she does not think that the patient had a CT of her head. She has been complaining of left sided intermittent headaches. No complaints of numbness or focal weakness.  No new injuries.   Daughter also reports the patient has had a mild cough over the past week. No chest pain or shortness of breath. Patient denies fevers, vomiting but has had intermittent diarrhea. She states that she is supposed to be on daily Macrodantin for urinary tract infection but is not on this medication currently.  Daughter Kenney Houseman - [phone]  ROS:  See HPI Constitutional: no fever  Eyes: no drainage  ENT: no runny nose   Cardiovascular:  no chest pain  Resp: no SOB  GI: no vomiting GU: no dysuria Integumentary: no rash  Allergy: no hives  Musculoskeletal: no leg swelling  Neurological: no slurred speech ROS otherwise negative  PAST MEDICAL HISTORY/PAST SURGICAL HISTORY:  Past Medical History:  Diagnosis Date  . Anxiety   . Aortic stenosis    mild  . Arthritis   . Atrial fibrillation ([place])   . Bladder infection, chronic   . Cerebral infarction, chronic 2012   left parietal  . COPD (chronic obstructive pulmonary disease) ([place])   . Depression   . Diabetes mellitus, type 2 ([place])   . Fibromyalgia   . Gait disorder 07/04/2014  . GERD (gastroesophageal reflux disease)   . Insomnia   . Memory difficulties 07/04/2014  . Mitral valve prolapse   . Osteoporosis   . Seasonal allergies   . Stroke ([place])   . Thoracic aortic aneurysm ([place])   . Uterine prolapse     MEDICATIONS:  Prior to Admission medications   Medication Sig Start Date End Date Taking? Authorizing Provider  ALPRAZolam Duanne Moron) 0.5 MG tablet Take 0.25 mg by mouth 3 (three) times daily as needed.     [provider]  amitriptyline (ELAVIL) 25 MG tablet Take 50 mg by mouth at bedtime.     [provider]  BIOTIN PO Take 1 tablet by mouth daily.    [provider]  Calcium Carb-Cholecalciferol (CALCIUM 500 +  D) 500-400 MG-UNIT TABS Take by mouth 2 (two) times daily. Reported on 05/16/2015    [provider]  cholecalciferol (VITAMIN D) 400 UNITS TABS tablet Take 400 Units by mouth daily.    [provider]  co-enzyme Q-10 30 MG capsule Take 30 mg by mouth 2 (two) times daily.     [provider]  cyanocobalamin (,VITAMIN B-12,) 1000 MCG/ML injection Inject 1,000 mcg into the muscle every 14 (fourteen) days.    [provider]  donepezil (ARICEPT) 5 MG tablet Take 5 mg by mouth at bedtime.    [provider]  fish oil-omega-3 fatty acids 1000 MG capsule Take 2 g by mouth daily.    [provider]  Magnesium 100 MG TABS Take by mouth daily.    [provider]  Manganese 50 MG TABS Take by mouth daily.    [provider]  Multiple Vitamins-Minerals (MULTIVITAMIN PO) Take by mouth 2 (two) times daily.     [provider]  thiamine 100 MG tablet Take 100 mg by mouth daily.    [provider]  vitamin E 400 UNIT capsule Take 400 Units by mouth every other day.     [provider]  warfarin (COUMADIN) 3 MG tablet Take 3 mg by mouth daily. Or as directed    [provider]    ALLERGIES:  Allergies  Allergen Reactions  . Bisphosphonates     REACTION: states intol to all bisphos \\T \ Evista too...  . Cefuroxime Axetil     nausea  . Codeine     REACTION: nausea  . Escitalopram Oxalate     REACTION: nervousness  . Macrobid [Nitrofurantoin Monohyd Macro]     Abdominal pain  . Milnacipran     REACTION: blurred vision, cough  . Penicillins     REACTION: rash  . Seroquel [Quetiapine Fumarate]     Confusion and lost muscle tone  . Tramadol     Swelling in lips  . Venlafaxine     REACTION: vomiting and flusing  . Zithromax [Azithromycin]     Felt like coming out of skin  . Sulfa Antibiotics Rash    SOCIAL HISTORY:  Social History  Substance Use Topics  . Smoking status: Never Smoker  . Smokeless tobacco: Never Used  . Alcohol use No    FAMILY HISTORY: Family History  Problem Relation Age of Onset  . Breast cancer Sister   . Diabetes Sister   . Ovarian cancer Sister   . GER disease Sister   . Kidney disease Mother   . Thyroid disease Mother        benign goiter  . Pneumonia Father   . Diabetes Brother   . Diabetes Maternal Grandmother   . Heart disease Maternal Grandmother   . Heart attack Maternal Grandmother   . Anxiety disorder Sister   . Diabetes Daughter   . Anxiety disorder Daughter      EXAM: BP 134/83 (BP Location: Left Arm)   Pulse 86   Temp 98.3 F (36.8 C) (Oral)   Resp 16   SpO2 100%  CONSTITUTIONAL: Alert and oriented and responds appropriately to questions. Elderly, chronically ill-appearing, afebrile, nontoxic HEAD: Normocephalic, atraumatic EYES: Conjunctivae clear, pupils appear equal, EOMI ENT: normal nose; moist mucous membranes; No pharyngeal erythema or petechiae, no tonsillar hypertrophy or exudate, no uvular deviation, no unilateral swelling, no trismus or drooling, no muffled voice, normal phonation, no stridor, no dental caries present, no drainable dental  abscess noted, no Ludwig's angina, tongue sits flat in the bottom of the mouth, no angioedema, no facial erythema or warmth, no facial swelling; no pain with movement of the neck.  TMs are clear bilaterally without erythema, purulence, bulging, perforation, effusion.  No cerumen impaction or sign of foreign body in the external auditory canal. There is a large amount of wax noted in the left ear but no complete impaction. No inflammation, erythema or drainage from the external auditory canal. No signs of mastoiditis. No pain with manipulation of the pinna bilaterally. NECK: Supple, no meningismus, no nuchal rigidity, no LAD; no midline spinal tenderness or step-off or deformity CARD: RRR; S1 and S2 appreciated; no murmurs, no clicks, no rubs, no gallops RESP: Normal chest excursion without splinting or tachypnea; breath sounds clear and equal bilaterally; no wheezes, no rhonchi, no rales, no hypoxia or respiratory distress, speaking full sentences ABD/GI: Normal bowel sounds; non-distended; soft, non-tender, no rebound, no guarding, no peritoneal signs, no hepatosplenomegaly BACK:  The back appears normal and is non-tender to palpation, there is no CVA tenderness, no midline spinal tenderness, step-off or deformity. Patient has thoracic kyphosis. EXT: Normal ROM in all joints; non-tender to palpation; no  edema; normal capillary refill; no cyanosis, no calf tenderness or swelling    SKIN: Normal color for age and race; warm; no rash NEURO: Moves all extremities equally, strength 5/5 in all 4 extremities, difficult to test sensation as patient is unable to answer if she has normal sensation on exam, cranial nerves II through XII intact PSYCH:  Patient is very aggressive, yelling at this physician. Daughter reports paranoia, hallucinations. Patient denies SI or HI. Grooming and personal hygiene are appropriate.  MEDICAL DECISION MAKING: Patient here with worsening paranoia and hallucinations. Daughter is concerned for patient safety. Initial workup shows urinary tract infection which is chronic for patient. Will send urine culture and give dose of IV ceftriaxone. We'll obtain CT of her head given complaints of headache almost 2 weeks ago. No midline spinal tenderness on exam. No obvious focal neurologic deficits. We'll obtain chest x-ray given complaints of cough. Once medically cleared, will discuss with TTS for evaluation as patient may need geri-psychiatric placement.  ED PROGRESS: 1:19 AM  Pt's head CT is unremarkable. Chest x-ray is clear. Will start patient on Keflex twice daily for the next week. I do not feel she needs medical admission for this urinary tract infection and she has no focal neurologic deficits and is oriented 3. I do not feel it's the cause of her increasing paranoia and daughter agrees. She has had many urinary tract infections in the past and has never had the symptoms. I have discussed case with Marijean Bravo with behavioral health and they have recommended inpatient geri-psych placement and I agree.  They will search for placement. Her medications have been reordered. Patient is stable.   I reviewed all nursing notes, vitals, pertinent previous records, EKGs, lab and urine results, imaging (as available).      EKG Interpretation  Date/Time:  Sunday December 07 2016 00:18:17  EDT Ventricular Rate:  63 PR Interval:    QRS Duration: 102 QT Interval:  421 QTC Calculation: 431 R Axis:   24 Text Interpretation:  Sinus rhythm Probable left atrial enlargement Baseline wander in lead(s) V2 Confirmed by Ward, Cyril Mourning [phone]) on 12/07/2016 12:22:59 AM          Ward, Delice Bison, DO 12/07/16 0121    Ward, Delice Bison, DO 12/07/16 0129    Ward,  Delice Bison, DO 12/07/16 [phone]

## 2016-12-06 NOTE — ED Triage Notes (Signed)
Pt brought to ED by EMS from home for increase disorientation and confusion for the past 3 weeks, pt is been seen by PCP with not abnormal labs, daughter call PCP on call and they suggested that pt comes to [facility] ED to have a [place] evaluation. No fever, nausea, vomiting or chills.

## 2016-12-07 DIAGNOSIS — R05 Cough: Secondary | ICD-10-CM | POA: Diagnosis not present

## 2016-12-07 LAB — CBG MONITORING, ED: GLUCOSE-CAPILLARY: 123 mg/dL — AB (ref 65–99)

## 2016-12-07 LAB — PROTIME-INR
INR: 1.93
PROTHROMBIN TIME: 21.9 s — AB (ref 11.4–15.2)

## 2016-12-07 MED ORDER — HYDROCODONE-ACETAMINOPHEN 5-325 MG PO TABS
1.0000 | ORAL_TABLET | Freq: Once | ORAL | Status: AC
  Administered 2016-12-07: 1 via ORAL
  Filled 2016-12-07: qty 1

## 2016-12-07 MED ORDER — ADULT MULTIVITAMIN W/MINERALS CH
1.0000 | ORAL_TABLET | Freq: Every day | ORAL | Status: DC
  Administered 2016-12-07 – 2016-12-09 (×3): 1 via ORAL
  Filled 2016-12-07 (×3): qty 1

## 2016-12-07 MED ORDER — POLYETHYLENE GLYCOL 3350 17 G PO PACK
17.0000 g | PACK | Freq: Every day | ORAL | Status: DC
  Administered 2016-12-08 – 2016-12-09 (×2): 17 g via ORAL
  Filled 2016-12-07 (×2): qty 1

## 2016-12-07 MED ORDER — COENZYME Q10 30 MG PO CAPS: 30.0000 mg | ORAL_CAPSULE | Freq: Two times a day (BID) | ORAL | Status: DC

## 2016-12-07 MED ORDER — WARFARIN SODIUM 3 MG PO TABS: 3.0000 mg | ORAL_TABLET | Freq: Every day | ORAL | Status: DC

## 2016-12-07 MED ORDER — OMEGA-3-ACID ETHYL ESTERS 1 G PO CAPS
2.0000 g | ORAL_CAPSULE | Freq: Every day | ORAL | Status: DC
  Administered 2016-12-07 – 2016-12-09 (×3): 2 g via ORAL
  Filled 2016-12-07 (×3): qty 2

## 2016-12-07 MED ORDER — OLANZAPINE 5 MG PO TABS
2.5000 mg | ORAL_TABLET | ORAL | Status: DC
  Administered 2016-12-08 – 2016-12-09 (×2): 2.5 mg via ORAL
  Filled 2016-12-07 (×2): qty 1

## 2016-12-07 MED ORDER — ALPRAZOLAM 0.5 MG PO TABS
0.5000 mg | ORAL_TABLET | Freq: Every day | ORAL | Status: DC
  Administered 2016-12-07 – 2016-12-08 (×3): 0.5 mg via ORAL
  Filled 2016-12-07: qty 1
  Filled 2016-12-07: qty 2
  Filled 2016-12-07: qty 1

## 2016-12-07 MED ORDER — VITAMIN E 180 MG (400 UNIT) PO CAPS
400.0000 [IU] | ORAL_CAPSULE | ORAL | Status: DC
  Administered 2016-12-08: 400 [IU] via ORAL
  Filled 2016-12-07: qty 1

## 2016-12-07 MED ORDER — BACID PO TABS
1.0000 | ORAL_TABLET | Freq: Two times a day (BID) | ORAL | Status: DC
  Administered 2016-12-07 – 2016-12-09 (×5): 1 via ORAL
  Filled 2016-12-07 (×7): qty 1

## 2016-12-07 MED ORDER — AMITRIPTYLINE HCL 25 MG PO TABS
50.0000 mg | ORAL_TABLET | Freq: Every day | ORAL | Status: DC
Start: 2016-12-07 — End: 2016-12-10
  Administered 2016-12-07 – 2016-12-08 (×3): 50 mg via ORAL
  Filled 2016-12-07 (×3): qty 2

## 2016-12-07 MED ORDER — VITAMIN B-1 100 MG PO TABS
100.0000 mg | ORAL_TABLET | Freq: Every day | ORAL | Status: DC
  Administered 2016-12-07 – 2016-12-09 (×3): 100 mg via ORAL
  Filled 2016-12-07 (×3): qty 1

## 2016-12-07 MED ORDER — CEPHALEXIN 250 MG PO CAPS
500.0000 mg | ORAL_CAPSULE | Freq: Two times a day (BID) | ORAL | Status: DC
  Administered 2016-12-07 – 2016-12-09 (×6): 500 mg via ORAL
  Filled 2016-12-07 (×6): qty 2

## 2016-12-07 MED ORDER — SUPER PAPAYA PLUS PO CHEW: CHEWABLE_TABLET | Freq: Every day | ORAL | Status: DC | PRN

## 2016-12-07 MED ORDER — MAGNESIUM MALATE 1250 (141.7 MG) MG PO TABS: ORAL_TABLET | Freq: Two times a day (BID) | ORAL | Status: DC

## 2016-12-07 MED ORDER — PANTOPRAZOLE SODIUM 40 MG PO TBEC
40.0000 mg | DELAYED_RELEASE_TABLET | Freq: Every day | ORAL | Status: DC
Start: 2016-12-07 — End: 2016-12-10
  Administered 2016-12-07 – 2016-12-09 (×3): 40 mg via ORAL
  Filled 2016-12-07 (×3): qty 1

## 2016-12-07 MED ORDER — CALCIUM CARBONATE-VITAMIN D 500-200 MG-UNIT PO TABS
1.0000 | ORAL_TABLET | Freq: Two times a day (BID) | ORAL | Status: DC
  Administered 2016-12-07 – 2016-12-09 (×4): 1 via ORAL
  Filled 2016-12-07 (×8): qty 1

## 2016-12-07 MED ORDER — DONEPEZIL HCL 5 MG PO TABS
5.0000 mg | ORAL_TABLET | Freq: Every day | ORAL | Status: DC
  Administered 2016-12-07 – 2016-12-08 (×2): 5 mg via ORAL
  Filled 2016-12-07 (×3): qty 1

## 2016-12-07 MED ORDER — MANGANESE 50 MG PO TABS: ORAL_TABLET | Freq: Every day | ORAL | Status: DC

## 2016-12-07 MED ORDER — MAGNESIUM OXIDE 400 (241.3 MG) MG PO TABS
400.0000 mg | ORAL_TABLET | Freq: Every day | ORAL | Status: DC
  Administered 2016-12-07 – 2016-12-09 (×3): 400 mg via ORAL
  Filled 2016-12-07 (×3): qty 1

## 2016-12-07 MED ORDER — WARFARIN SODIUM 1 MG PO TABS
1.5000 mg | ORAL_TABLET | ORAL | Status: DC
  Administered 2016-12-09: 1.5 mg via ORAL
  Filled 2016-12-07: qty 1

## 2016-12-07 MED ORDER — VITAMIN D 1000 UNITS PO TABS
1000.0000 [IU] | ORAL_TABLET | Freq: Every day | ORAL | Status: DC
  Administered 2016-12-07 – 2016-12-09 (×3): 1000 [IU] via ORAL
  Filled 2016-12-07 (×3): qty 1

## 2016-12-07 MED ORDER — WARFARIN - PHYSICIAN DOSING INPATIENT
Freq: Every day | Status: DC
  Administered 2016-12-09: 18:00:00

## 2016-12-07 MED ORDER — ONDANSETRON 4 MG PO TBDP
4.0000 mg | ORAL_TABLET | Freq: Three times a day (TID) | ORAL | Status: DC | PRN
  Administered 2016-12-07: 4 mg via ORAL
  Filled 2016-12-07: qty 1

## 2016-12-07 MED ORDER — WARFARIN SODIUM 3 MG PO TABS
3.0000 mg | ORAL_TABLET | ORAL | Status: DC
  Filled 2016-12-07 (×3): qty 1

## 2016-12-07 NOTE — ED Notes (Signed)
Daughter brought [place] paperwork - copy faxed to [facility], copy sent to Medical Records, and copy placed on clipboard. Original given back to daughter. Daughter also brought pt's glasses x 2 pair and Bible - in room w/pt.

## 2016-12-07 NOTE — ED Notes (Signed)
Pt on the phone with granddaughter 

## 2016-12-07 NOTE — Progress Notes (Signed)
PHARMACIST - PHYSICIAN ORDER COMMUNICATION  CONCERNING: P&T Medication Policy on Herbal Medications  DESCRIPTION:  This patient's order for:  CoQ10 and Manganese  has been noted.  This product(s) is classified as an "herbal" or natural product. Due to a lack of definitive safety studies or FDA approval, nonstandard manufacturing practices, plus the potential risk of unknown drug-drug interactions while on inpatient medications, the Pharmacy and Therapeutics Committee does not permit the use of "herbal" or natural products of this type within [facility].   ACTION TAKEN: The pharmacy department is unable to verify this order at this time and your patient has been informed of this safety policy. Please reevaluate patient's clinical condition at discharge and address if the herbal or natural product(s) should be resumed at that time.

## 2016-12-07 NOTE — ED Notes (Signed)
Introduced self to pt, moved pt to hospital bed for comfort.  Pt reported she was too tired and weak to get up, staffed assisted her to bed.  RN explained pod F rules to daughter and pt, daughter signed as instructed by pt.  Daughter, Kenney Houseman took all belongings home and will be back in the morning to visit at visiting time.

## 2016-12-07 NOTE — ED Notes (Signed)
Pt being bathed.  

## 2016-12-07 NOTE — Progress Notes (Signed)
Pt in need of inpatient geriatric psychiatric treatment per TTS assessment completed 12/07/16. Pt has been referred to: Geographical information systems officer (for [employer] or [employer])  Other geri-psych facilities contacted are at capacity- some advised likely DCs Monday 10/15.  Spoke with pt's daughter Kenney Houseman via phone [phone]. She is bringing POA paperwork to ED so that copy can be kept in pt's chart. Does not know if it also contains Healthcare POA, states she will need to review paperwork. CSW explained often when there is question of altered mental status/psychosis, geripsych programs will require either [place] to sign pt in for admission or pt to be IVCd. Should this issue arise, pt would need to be evaluated for ability to show understanding and willingness to sign in voluntarily, or meeting IVC criteria. Daughter understanding and will find legal paperwork in order to make appropriate arrangements once inpatient placement found.  Sharren Bridge, MSW, LCSW Clinical Social Work 12/07/2016 Coverage for [phone]

## 2016-12-07 NOTE — BH Assessment (Addendum)
Tele Assessment Note   Patient Name: Kelsey Griffin MRN: [id number] Referring Physician: Pryor Curia, DO Location of Patient: [facility] Location of Provider: [facility] is an 83 y.o. widowed female who presents to [facility] via EMS and accompanied by her daughter, Kelsey Griffin, who participated in assessment at Pt's request. Pt says she came to the ED because she has fallen recently and has pain. Pt lives with her daughter. Pt's daughter reports that over the past three weeks Pt has appeared confused and paranoid. She contacted Pt's PCP who recommended Pt come to ED for evaluation. Pt acknowledges that she was watching television recently and that her family members were part of the television shows all day. Pt's daughter says Pt is fearful that someone has tapped the telephone. She believes that the neighbors have turtles that are trying to enter the house and harm her. Pt's thought process is coherent but tangential and Pt responds to questions with long stories. Pt denies being depressed but does report grieving the loss of her husband, who died three years ago. Pt states that she sleeps well however Pt's daughter says Pt will stay awake for over 24 hours and then sleep for long periods of time. Pt reports her appetite is good but her daughter says Pt has been eating less recently. Pt denies current suicidal ideation or history of suicide attempts. Pt denies auditory or visual hallucinations. Pt denies thoughts of harming others and has no history of aggression. Pt has no history of alcohol or substance abuse.  Pt reports that she is here because she has had 4 falls in the past few weeks. Her last fall was October 3. Pt's daughter says Pt uses a walker with four wheels to ambulate and needs assistance with bathing. Pt has hearing loss in one ear but was able to hear the tele-cart. Pt states she has several family members who are supportive. She says she  has experienced financial stress following the death of her husband. Pt says she is active in church and quotes the Bible frequently. Pt has no history of inpatient or outpatient mental health treatment.  Pt is dressed in hospital gown, alert and oriented x4. Pt speaks in a clear tone, at normal volume and pace. Motor behavior appears normal. Eye contact is good. Pt's mood is euthymic and affect is congruent with mood. Thought process is coherent and tangential. There is no indication Pt is currently responding to internal stimuli. Pt was cooperative throughout assessment.   Diagnosis: Major neurocognitive disorder Without behavioral disturbance  Past Medical History:  Past Medical History:  Diagnosis Date  . Anxiety   . Aortic stenosis    mild  . Arthritis   . Atrial fibrillation ([place])   . Bladder infection, chronic   . Cerebral infarction, chronic 2012   left parietal  . COPD (chronic obstructive pulmonary disease) ([place])   . Depression   . Diabetes mellitus, type 2 ([place])   . Fibromyalgia   . Gait disorder 07/04/2014  . GERD (gastroesophageal reflux disease)   . Insomnia   . Memory difficulties 07/04/2014  . Mitral valve prolapse   . Osteoporosis   . Seasonal allergies   . Stroke ([place])   . Thoracic aortic aneurysm ([place])   . Uterine prolapse     Past Surgical History:  Procedure Laterality Date  . APPENDECTOMY    . bladder tack  1959, 1962  . breast lump removed  1962,  [place]     . HERNIA REPAIR    . ovarian cyst resection on left  1958  . TUBAL LIGATION      Family History:  Family History  Problem Relation Age of Onset  . Breast cancer Sister   . Diabetes Sister   . Ovarian cancer Sister   . GER disease Sister   . Kidney disease Mother   . Thyroid disease Mother        benign goiter  . Pneumonia Father   . Diabetes Brother   . Diabetes Maternal Grandmother   . Heart disease Maternal Grandmother   . Heart attack Maternal Grandmother   . Anxiety  disorder Sister   . Diabetes Daughter   . Anxiety disorder Daughter     Social History:  reports that she has never smoked. She has never used smokeless tobacco. She reports that she does not drink alcohol or use drugs.  Additional Social History:  Alcohol / Drug Use Pain Medications: See MAR Prescriptions: See MAR Over the Counter: See MAR History of alcohol / drug use?: No history of alcohol / drug abuse Longest period of sobriety (when/how long): NA  CIWA: CIWA-Ar BP: 138/63 Pulse Rate: 60 COWS:    PATIENT STRENGTHS: (choose at least two) Average or above average intelligence Communication skills General fund of knowledge Supportive family/friends  Allergies:  Allergies  Allergen Reactions  . Bisphosphonates     REACTION: states intol to all bisphos \\T \ Evista too...  . Cefuroxime Axetil     nausea  . Codeine     REACTION: nausea  . Escitalopram Oxalate     REACTION: nervousness  . Macrobid [Nitrofurantoin Monohyd Macro]     Abdominal pain  . Milnacipran     REACTION: blurred vision, cough  . Penicillins     REACTION: rash  . Seroquel [Quetiapine Fumarate]     Confusion and lost muscle tone  . Tramadol     Swelling in lips  . Venlafaxine     REACTION: vomiting and flusing  . Zithromax [Azithromycin]     Felt like coming out of skin  . Sulfa Antibiotics Rash    Home Medications:  (Not in a hospital admission)  OB/GYN Status:  No LMP recorded. Patient is postmenopausal.  General Assessment Data Location of Assessment: [facility] ED TTS Assessment: In system Is this a Tele or Face-to-Face Assessment?: Tele Assessment Is this an Initial Assessment or a Re-assessment for this encounter?: Initial Assessment Marital status: Widowed [place] name: NA Is patient pregnant?: No Pregnancy Status: No Living Arrangements: Children (Lives with daughter) Can pt return to current living arrangement?: Yes Admission Status: Voluntary Is patient capable of signing voluntary  admission?: Yes Referral Source: MD (Primary care physician) Insurance type: Medicare     Crisis Care Plan Living Arrangements: Children (Lives with daughter) Legal Guardian: Other: (Self) Name of Psychiatrist: None Name of Therapist: None  Education Status Is patient currently in school?: No Current Grade: NA Highest grade of school patient has completed: NA Name of school: NA Contact person: NA  Risk to self with the past 6 months Suicidal Ideation: No Has patient been a risk to self within the past 6 months prior to admission? : No Suicidal Intent: No Has patient had any suicidal intent within the past 6 months prior to admission? : No Is patient at risk for suicide?: No Suicidal Plan?: No Has patient had any suicidal plan within the past 6 months prior to admission? :  No Access to Means: No What has been your use of drugs/alcohol within the last 12 months?: None Previous Attempts/Gestures: No How many times?: 0 Other Self Harm Risks: None Triggers for Past Attempts: None known Intentional Self Injurious Behavior: None Family Suicide History: Unknown Recent stressful life event(s): Other (Comment) (Recent falls) Persecutory voices/beliefs?: No Depression: No Depression Symptoms: Insomnia, Fatigue Substance abuse history and/or treatment for substance abuse?: No Suicide prevention information given to non-admitted patients: Not applicable  Risk to Others within the past 6 months Homicidal Ideation: No Does patient have any lifetime risk of violence toward others beyond the six months prior to admission? : No Thoughts of Harm to Others: No Current Homicidal Intent: No Current Homicidal Plan: No Access to Homicidal Means: No Identified Victim: None History of harm to others?: No Assessment of Violence: None Noted Violent Behavior Description: No history of violence Does patient have access to weapons?: No Criminal Charges Pending?: No Does patient have a court  date: No Is patient on probation?: No  Psychosis Hallucinations: None noted Delusions: Persecutory  Mental Status Report Appearance/Hygiene: In hospital gown Eye Contact: Good Motor Activity: Unremarkable Speech: Logical/coherent, Tangential Level of Consciousness: Alert Mood: Euthymic Affect: Appropriate to circumstance Anxiety Level: None Thought Processes: Coherent, Tangential Judgement: Impaired Orientation: Person, Place, Time, Situation Obsessive Compulsive Thoughts/Behaviors: None  Cognitive Functioning Concentration: Decreased Memory: Recent Intact, Remote Intact IQ: Average Insight: Fair Impulse Control: Good Appetite: Fair Weight Loss: 0 Weight Gain: 0 Sleep: Decreased Total Hours of Sleep: 4 Vegetative Symptoms: None  ADLScreening [facility] Assessment Services) Patient's cognitive ability adequate to safely complete daily activities?: Yes Patient able to express need for assistance with ADLs?: Yes Independently performs ADLs?: No  Prior Inpatient Therapy Prior Inpatient Therapy: No Prior Therapy Dates: NA Prior Therapy Facilty/Provider(s): NA Reason for Treatment: NA  Prior Outpatient Therapy Prior Outpatient Therapy: No Prior Therapy Dates: NA Prior Therapy Facilty/Provider(s): NA Reason for Treatment: NA Does patient have an ACCT team?: No Does patient have Intensive In-House Services?  : No Does patient have [facility] services? : No Does patient have P4CC services?: No  ADL Screening (condition at time of admission) Patient's cognitive ability adequate to safely complete daily activities?: Yes Is the patient deaf or have difficulty hearing?: Yes Does the patient have difficulty seeing, even when wearing glasses/contacts?: No Does the patient have difficulty concentrating, remembering, or making decisions?: Yes Patient able to express need for assistance with ADLs?: Yes Does the patient have difficulty dressing or bathing?: Yes Independently  performs ADLs?: No Communication: Independent Dressing (OT): Independent Grooming: Independent Feeding: Independent Bathing: Needs assistance Is this a change from baseline?: Pre-admission baseline Toileting: Independent In/Out Bed: Independent Walks in Home: Independent with device (comment) Does the patient have difficulty walking or climbing stairs?: Yes Weakness of Legs: Both Weakness of Arms/Hands: None  Home Assistive Devices/Equipment Home Assistive Devices/Equipment: Environmental consultant (specify type)    Abuse/Neglect Assessment (Assessment to be complete while patient is alone) Physical Abuse: Denies Verbal Abuse: Denies Sexual Abuse: Denies Exploitation of patient/patient's resources: Denies Self-Neglect: Denies     Regulatory affairs officer (For Healthcare) Does Patient Have a Medical Advance Directive?: No Would patient like information on creating a medical advance directive?: No - Patient declined    Additional Information 1:1 In Past 12 Months?: No CIRT Risk: No Elopement Risk: No Does patient have medical clearance?: Yes     Disposition: Gave clinical report to Lindon Romp, NP who recommended transfer to an inpatient geriatric-psychiatry facility. TTS will contact appropriate facilities  for placement. Noticed Dr. Cyril Mourning Ward and Janett Billow, RN of recommendation.  Disposition Initial Assessment Completed for this Encounter: Yes Disposition of Patient: Inpatient treatment program Type of inpatient treatment program: Adult (Geriatric)  This service was provided via telemedicine using a 2-way, interactive audio and Radiographer, therapeutic.  Names of all persons participating in this telemedicine service and their role in this encounter. Name: Roby Lofts Role: Patient  Name: Kelsey Griffin Role: Patient's daughter  Name: Storm Frisk, [place] Role: TTS counselor      Orpah Greek Anson Fret, [facility], [facility], [facility] Triage Specialist [phone]  Anson Fret, Orpah Greek 12/07/2016  1:28 AM

## 2016-12-07 NOTE — ED Notes (Addendum)
Daughter visiting w/pt - advised she is POA and will bring in paperwork at next visit. Also states she gives pt Miralax and Boost - Pharm Tech aware. States pt has K-Tape on her right hip d/t helps w/pain and keep it from feeling like it is slipping out.

## 2016-12-07 NOTE — ED Notes (Signed)
Daughter assisting pt w/bedpan as per her request.

## 2016-12-07 NOTE — ED Notes (Signed)
IV from right [facility] removed - cath intact - no redness/swelling noted to site - pressure held d/t pt takes Coumadin.

## 2016-12-08 LAB — URINE CULTURE

## 2016-12-08 LAB — PROTIME-INR
INR: 1.5
Prothrombin Time: 18 seconds — ABNORMAL HIGH (ref 11.4–15.2)

## 2016-12-08 NOTE — ED Triage Notes (Signed)
Psychiatrist  Here to see Pt. Psychiatrist also talked with Pt's  POA  via telephone.

## 2016-12-08 NOTE — ED Notes (Signed)
Pt bedding changed and repositioned

## 2016-12-08 NOTE — ED Notes (Signed)
Soft Diet was ordered for Lunch and The Patient was given Saltine Crackers and Bottle Water for snack.

## 2016-12-08 NOTE — BH Assessment (Addendum)
[name] Assessment Progress Note  Reassessment--Pt was calm, cooperative and oriented x4 during assessment and says she is here for a UTI. She states that she is still anxious, but she says in a soft voice that she cannot tell writer why. When asked about feeling scared if he talks about her husband's daughter who "tried to take over the house".  When asked about being afraid of turtles, states that turtles can burrow underground and breathe through their tails, but did not seem concerned about then. Pt's thought process was tangential at times, but not extreme. Pt denies SI, HI, AVH. TTS continuing to seek placement.  Collateral information: Spoke to pt's daughter, Ardyth Gal who said that pt has been dealing with depression/mood swings/confusion for several years, and that her PCP has diagnosed pt with Bipolar. Daughter said that she has been wanting to get her help--counseling and medication. She states that her stepfather was not a nice person, especially to her mom, and her daughter has been a point of concern for pt.  She states that her neighbor does have turtles and that animal control was called about these turtles and that they watchedd a nature show about turtles hibernating and breathing through their tails the other day, but pt became concerned that neighbors might release rattlesnakes and they might be harmed by this.  Pt's daughter said that she does not know if pt has not been taking her medication correctly at times. Pt had an episode of confusion in April when she was concerned about Pass[place] and feet washing and would not listen to others about the correct date for this. Pt has pushed the alarm at home which calls the fire department, 2x recently. Pt's MD changed her meds last Wednesday. Pt stays up all night, talks all night, tangential thinking.  TTS to continue to seek placement.

## 2016-12-08 NOTE — Progress Notes (Signed)
Pt meets criteria for inpatient in a gero-psych facility.  Additional referrals faxed to the following:  [facility], [facility], [facility]  Disposition CSWs will continue to follow for placement.

## 2016-12-08 NOTE — ED Triage Notes (Signed)
PT has visitor  In room.

## 2016-12-08 NOTE — ED Notes (Signed)
Patient was given a snack and water, and A Regular Diet was ordered for Dinner.

## 2016-12-08 NOTE — ED Notes (Signed)
Pt provided applesauce. Dinner tray still at [employer]

## 2016-12-09 LAB — PROTIME-INR
INR: 1.45
PROTHROMBIN TIME: 17.5 s — AB (ref 11.4–15.2)

## 2016-12-09 MED ORDER — CEPHALEXIN 500 MG PO CAPS: 500.0000 mg | ORAL_CAPSULE | Freq: Three times a day (TID) | ORAL | 0 refills | Status: AC

## 2016-12-09 NOTE — ED Provider Notes (Signed)
Her multiple discussions with Education officer, museum, patient's daughter is willing and would prefer to take the patient home. She has been calm while here. We'll continue her on 5 days of Keflex although culture showed multiple species. They can return here with any difficulties at home. Daughter plans on PCP and psychiatry follow-up appointments   Tanna Furry, MD 12/09/16 [phone]

## 2016-12-09 NOTE — ED Notes (Signed)
RN observed pt resting comfortably, no respiratory distress noted.

## 2016-12-09 NOTE — Progress Notes (Signed)
CSW received a call from pt's daughter, Ardyth Gal, who expressed concern that her mother has been in the ED since Saturday and has not been accepted to any of the hospitals where we have sent referrals.  Pt's daughter is her primary caregiver and lives with her in her mother's home.  Pt's daughter shares caregiving responsibility with her sister who lives close by and her daughter who is an Therapist, sports.  Pt's daughter reports that she is is a Quarry manager at [employer]. Pt's daughter stated, "We really want to bring her home.We don't want her to sit in the emergency room for another day"    CSW agreed to speak with the [place] to see if pt could be reassessed today and reassured pt's daughter that this writer would contact her with a status update. [facility] was unable to assess pt today.    This Probation officer contacted [facility] Psych ED, EDP, Dr. Jeneen Rinks, and explained the situation including the family's plans to care for the patient at home, as they had previously done, and to seek additional outpatient psychiatric care if that becomes necessary.  Dr. Jeneen Rinks agreed to the plan to discharge patient to home and will contact pt's Psych ED Nurse to initiate discharge process.  CSW also contacted pt's nurse, to inform of plan. Psych ED nurse agreed to contact pt's daughter when she was ready to be discharged.  CSW called pt's daughter to advise that EDP has agreed to discharge pt to home. Pt's daughter requested suggestions for future care. This Probation officer suggested that pt's daughter consider having her mother seen by a Proofreader and agreed to fax resource information to the ED prior to the pt's discharge.  Pt's daughter expressed appreciation.  Areatha Keas. Judi Cong, MSW, [place] Disposition Clinical Social Work [phone] (cell) [phone] (office)

## 2016-12-09 NOTE — BHH Counselor (Signed)
Re-assessment:   Patient report she feel pretty good. Patient speaking disoriented (talking about a kids meal). Patient discussing  kids having a safe place to stay.   Patient unable to answer TTS questions regarding SI, HI, and AVH.    Disposition: Patient continues to meet inpatient criteria for  [facility]. Per Ricky Ala, NP.

## 2016-12-09 NOTE — ED Notes (Signed)
[facility] called asking if pt continues to need inpt.

## 2016-12-09 NOTE — Discharge Instructions (Signed)
Follow up with your Primary Care Physician. Take Keflex for UTI until completed. Return here with any unforseen difficulties at home.

## 2016-12-09 NOTE — ED Notes (Signed)
Kenney Houseman, pt's daughter, aware pt being d/c'd to home - voiced agreement w/tx plan. States she will come get pt after she gets off work and goes home to get pt's clothing. States may be around 2000 this evening. Aware pt has rx for Keflex.

## 2016-12-09 NOTE — ED Notes (Signed)
Pt on phone talking w/her family.

## 2016-12-09 NOTE — ED Notes (Signed)
Pt repositioned in bed. Pure wick still in place.

## 2016-12-09 NOTE — ED Notes (Signed)
PureWick intact to pt draining yellow urine in suction canister.

## 2016-12-09 NOTE — ED Notes (Signed)
Female visitor w/pt.

## 2016-12-09 NOTE — ED Notes (Signed)
Pt's visitor assisted pt w/eating - encouraging her to eat.

## 2016-12-10 DIAGNOSIS — S098XXA Other specified injuries of head, initial encounter: Secondary | ICD-10-CM | POA: Diagnosis not present

## 2016-12-10 DIAGNOSIS — W06XXXA Fall from bed, initial encounter: Secondary | ICD-10-CM | POA: Diagnosis not present

## 2016-12-10 DIAGNOSIS — S299XXA Unspecified injury of thorax, initial encounter: Secondary | ICD-10-CM | POA: Diagnosis not present

## 2016-12-10 DIAGNOSIS — S199XXA Unspecified injury of neck, initial encounter: Secondary | ICD-10-CM | POA: Diagnosis not present

## 2016-12-10 DIAGNOSIS — G8929 Other chronic pain: Secondary | ICD-10-CM | POA: Diagnosis not present

## 2016-12-10 DIAGNOSIS — M5489 Other dorsalgia: Secondary | ICD-10-CM | POA: Diagnosis not present

## 2016-12-10 DIAGNOSIS — S0990XA Unspecified injury of head, initial encounter: Secondary | ICD-10-CM | POA: Diagnosis not present

## 2016-12-10 DIAGNOSIS — M545 Low back pain: Secondary | ICD-10-CM | POA: Diagnosis not present

## 2016-12-10 DIAGNOSIS — N3001 Acute cystitis with hematuria: Secondary | ICD-10-CM | POA: Diagnosis not present

## 2016-12-10 DIAGNOSIS — M25551 Pain in right hip: Secondary | ICD-10-CM | POA: Diagnosis not present

## 2016-12-10 DIAGNOSIS — R52 Pain, unspecified: Secondary | ICD-10-CM | POA: Diagnosis not present

## 2016-12-10 DIAGNOSIS — S3993XA Unspecified injury of pelvis, initial encounter: Secondary | ICD-10-CM | POA: Diagnosis not present

## 2016-12-10 DIAGNOSIS — G894 Chronic pain syndrome: Secondary | ICD-10-CM | POA: Diagnosis not present

## 2016-12-10 NOTE — ED Notes (Signed)
Pt's daughter came to pick her up, pt was pleased to see her and daughter informed ED that she did have help to get him in the house.  Pt unable to sign but indicated they understood discharge instructions including script.

## 2016-12-15 DIAGNOSIS — G8921 Chronic pain due to trauma: Secondary | ICD-10-CM | POA: Diagnosis not present

## 2016-12-15 DIAGNOSIS — M6281 Muscle weakness (generalized): Secondary | ICD-10-CM | POA: Diagnosis not present

## 2016-12-15 DIAGNOSIS — R269 Unspecified abnormalities of gait and mobility: Secondary | ICD-10-CM | POA: Diagnosis not present

## 2016-12-15 DIAGNOSIS — Z9181 History of falling: Secondary | ICD-10-CM | POA: Diagnosis not present

## 2016-12-15 DIAGNOSIS — N3000 Acute cystitis without hematuria: Secondary | ICD-10-CM | POA: Diagnosis not present

## 2016-12-15 DIAGNOSIS — Z7901 Long term (current) use of anticoagulants: Secondary | ICD-10-CM | POA: Diagnosis not present

## 2016-12-15 DIAGNOSIS — S098XXA Other specified injuries of head, initial encounter: Secondary | ICD-10-CM | POA: Diagnosis not present

## 2016-12-15 DIAGNOSIS — M150 Primary generalized (osteo)arthritis: Secondary | ICD-10-CM | POA: Diagnosis not present

## 2016-12-16 DIAGNOSIS — Z9181 History of falling: Secondary | ICD-10-CM | POA: Diagnosis not present

## 2016-12-16 DIAGNOSIS — M6281 Muscle weakness (generalized): Secondary | ICD-10-CM | POA: Diagnosis not present

## 2016-12-16 DIAGNOSIS — M150 Primary generalized (osteo)arthritis: Secondary | ICD-10-CM | POA: Diagnosis not present

## 2016-12-16 DIAGNOSIS — N3000 Acute cystitis without hematuria: Secondary | ICD-10-CM | POA: Diagnosis not present

## 2016-12-16 DIAGNOSIS — S098XXA Other specified injuries of head, initial encounter: Secondary | ICD-10-CM | POA: Diagnosis not present

## 2016-12-17 DIAGNOSIS — S098XXA Other specified injuries of head, initial encounter: Secondary | ICD-10-CM | POA: Diagnosis not present

## 2016-12-17 DIAGNOSIS — Z9181 History of falling: Secondary | ICD-10-CM | POA: Diagnosis not present

## 2016-12-17 DIAGNOSIS — M6281 Muscle weakness (generalized): Secondary | ICD-10-CM | POA: Diagnosis not present

## 2016-12-17 DIAGNOSIS — N3000 Acute cystitis without hematuria: Secondary | ICD-10-CM | POA: Diagnosis not present

## 2016-12-17 DIAGNOSIS — M150 Primary generalized (osteo)arthritis: Secondary | ICD-10-CM | POA: Diagnosis not present

## 2016-12-18 DIAGNOSIS — M150 Primary generalized (osteo)arthritis: Secondary | ICD-10-CM | POA: Diagnosis not present

## 2016-12-18 DIAGNOSIS — M6281 Muscle weakness (generalized): Secondary | ICD-10-CM | POA: Diagnosis not present

## 2016-12-18 DIAGNOSIS — Z9181 History of falling: Secondary | ICD-10-CM | POA: Diagnosis not present

## 2016-12-18 DIAGNOSIS — S098XXA Other specified injuries of head, initial encounter: Secondary | ICD-10-CM | POA: Diagnosis not present

## 2016-12-18 DIAGNOSIS — N3000 Acute cystitis without hematuria: Secondary | ICD-10-CM | POA: Diagnosis not present

## 2016-12-19 DIAGNOSIS — S098XXA Other specified injuries of head, initial encounter: Secondary | ICD-10-CM | POA: Diagnosis not present

## 2016-12-19 DIAGNOSIS — N3000 Acute cystitis without hematuria: Secondary | ICD-10-CM | POA: Diagnosis not present

## 2016-12-19 DIAGNOSIS — M6281 Muscle weakness (generalized): Secondary | ICD-10-CM | POA: Diagnosis not present

## 2016-12-19 DIAGNOSIS — M150 Primary generalized (osteo)arthritis: Secondary | ICD-10-CM | POA: Diagnosis not present

## 2016-12-19 DIAGNOSIS — Z9181 History of falling: Secondary | ICD-10-CM | POA: Diagnosis not present

## 2016-12-22 DIAGNOSIS — S098XXA Other specified injuries of head, initial encounter: Secondary | ICD-10-CM | POA: Diagnosis not present

## 2016-12-22 DIAGNOSIS — G8921 Chronic pain due to trauma: Secondary | ICD-10-CM | POA: Diagnosis not present

## 2016-12-22 DIAGNOSIS — N3000 Acute cystitis without hematuria: Secondary | ICD-10-CM | POA: Diagnosis not present

## 2016-12-22 DIAGNOSIS — R269 Unspecified abnormalities of gait and mobility: Secondary | ICD-10-CM | POA: Diagnosis not present

## 2016-12-22 DIAGNOSIS — M150 Primary generalized (osteo)arthritis: Secondary | ICD-10-CM | POA: Diagnosis not present

## 2016-12-22 DIAGNOSIS — M6281 Muscle weakness (generalized): Secondary | ICD-10-CM | POA: Diagnosis not present

## 2016-12-22 DIAGNOSIS — Z9181 History of falling: Secondary | ICD-10-CM | POA: Diagnosis not present

## 2016-12-23 DIAGNOSIS — Z9181 History of falling: Secondary | ICD-10-CM | POA: Diagnosis not present

## 2016-12-23 DIAGNOSIS — S098XXA Other specified injuries of head, initial encounter: Secondary | ICD-10-CM | POA: Diagnosis not present

## 2016-12-23 DIAGNOSIS — N3000 Acute cystitis without hematuria: Secondary | ICD-10-CM | POA: Diagnosis not present

## 2016-12-23 DIAGNOSIS — M6281 Muscle weakness (generalized): Secondary | ICD-10-CM | POA: Diagnosis not present

## 2016-12-23 DIAGNOSIS — M150 Primary generalized (osteo)arthritis: Secondary | ICD-10-CM | POA: Diagnosis not present

## 2016-12-24 DIAGNOSIS — S098XXA Other specified injuries of head, initial encounter: Secondary | ICD-10-CM | POA: Diagnosis not present

## 2016-12-24 DIAGNOSIS — M6281 Muscle weakness (generalized): Secondary | ICD-10-CM | POA: Diagnosis not present

## 2016-12-24 DIAGNOSIS — N3000 Acute cystitis without hematuria: Secondary | ICD-10-CM | POA: Diagnosis not present

## 2016-12-24 DIAGNOSIS — M150 Primary generalized (osteo)arthritis: Secondary | ICD-10-CM | POA: Diagnosis not present

## 2016-12-24 DIAGNOSIS — Z9181 History of falling: Secondary | ICD-10-CM | POA: Diagnosis not present

## 2016-12-25 DIAGNOSIS — M150 Primary generalized (osteo)arthritis: Secondary | ICD-10-CM | POA: Diagnosis not present

## 2016-12-25 DIAGNOSIS — M545 Low back pain: Secondary | ICD-10-CM | POA: Diagnosis not present

## 2016-12-25 DIAGNOSIS — R296 Repeated falls: Secondary | ICD-10-CM | POA: Diagnosis not present

## 2016-12-25 DIAGNOSIS — Z9181 History of falling: Secondary | ICD-10-CM | POA: Diagnosis not present

## 2016-12-25 DIAGNOSIS — R69 Illness, unspecified: Secondary | ICD-10-CM | POA: Diagnosis not present

## 2016-12-25 DIAGNOSIS — R51 Headache: Secondary | ICD-10-CM | POA: Diagnosis not present

## 2016-12-25 DIAGNOSIS — K117 Disturbances of salivary secretion: Secondary | ICD-10-CM | POA: Diagnosis not present

## 2016-12-25 DIAGNOSIS — S098XXA Other specified injuries of head, initial encounter: Secondary | ICD-10-CM | POA: Diagnosis not present

## 2016-12-25 DIAGNOSIS — N3000 Acute cystitis without hematuria: Secondary | ICD-10-CM | POA: Diagnosis not present

## 2016-12-25 DIAGNOSIS — M6281 Muscle weakness (generalized): Secondary | ICD-10-CM | POA: Diagnosis not present

## 2016-12-26 DIAGNOSIS — M150 Primary generalized (osteo)arthritis: Secondary | ICD-10-CM | POA: Diagnosis not present

## 2016-12-26 DIAGNOSIS — N390 Urinary tract infection, site not specified: Secondary | ICD-10-CM | POA: Diagnosis not present

## 2016-12-26 DIAGNOSIS — Z9181 History of falling: Secondary | ICD-10-CM | POA: Diagnosis not present

## 2016-12-26 DIAGNOSIS — M6281 Muscle weakness (generalized): Secondary | ICD-10-CM | POA: Diagnosis not present

## 2016-12-26 DIAGNOSIS — S098XXA Other specified injuries of head, initial encounter: Secondary | ICD-10-CM | POA: Diagnosis not present

## 2016-12-26 DIAGNOSIS — N3000 Acute cystitis without hematuria: Secondary | ICD-10-CM | POA: Diagnosis not present

## 2016-12-29 DIAGNOSIS — N3000 Acute cystitis without hematuria: Secondary | ICD-10-CM | POA: Diagnosis not present

## 2016-12-29 DIAGNOSIS — M150 Primary generalized (osteo)arthritis: Secondary | ICD-10-CM | POA: Diagnosis not present

## 2016-12-29 DIAGNOSIS — M6281 Muscle weakness (generalized): Secondary | ICD-10-CM | POA: Diagnosis not present

## 2016-12-29 DIAGNOSIS — Z9181 History of falling: Secondary | ICD-10-CM | POA: Diagnosis not present

## 2016-12-29 DIAGNOSIS — S098XXA Other specified injuries of head, initial encounter: Secondary | ICD-10-CM | POA: Diagnosis not present

## 2016-12-30 DIAGNOSIS — M6281 Muscle weakness (generalized): Secondary | ICD-10-CM | POA: Diagnosis not present

## 2016-12-30 DIAGNOSIS — S098XXA Other specified injuries of head, initial encounter: Secondary | ICD-10-CM | POA: Diagnosis not present

## 2016-12-30 DIAGNOSIS — Z9181 History of falling: Secondary | ICD-10-CM | POA: Diagnosis not present

## 2016-12-30 DIAGNOSIS — N3000 Acute cystitis without hematuria: Secondary | ICD-10-CM | POA: Diagnosis not present

## 2016-12-30 DIAGNOSIS — M150 Primary generalized (osteo)arthritis: Secondary | ICD-10-CM | POA: Diagnosis not present

## 2016-12-31 DIAGNOSIS — M6281 Muscle weakness (generalized): Secondary | ICD-10-CM | POA: Diagnosis not present

## 2016-12-31 DIAGNOSIS — M150 Primary generalized (osteo)arthritis: Secondary | ICD-10-CM | POA: Diagnosis not present

## 2016-12-31 DIAGNOSIS — S098XXA Other specified injuries of head, initial encounter: Secondary | ICD-10-CM | POA: Diagnosis not present

## 2016-12-31 DIAGNOSIS — N3000 Acute cystitis without hematuria: Secondary | ICD-10-CM | POA: Diagnosis not present

## 2016-12-31 DIAGNOSIS — Z9181 History of falling: Secondary | ICD-10-CM | POA: Diagnosis not present

## 2017-01-02 DIAGNOSIS — M6281 Muscle weakness (generalized): Secondary | ICD-10-CM | POA: Diagnosis not present

## 2017-01-02 DIAGNOSIS — Z9181 History of falling: Secondary | ICD-10-CM | POA: Diagnosis not present

## 2017-01-02 DIAGNOSIS — S098XXA Other specified injuries of head, initial encounter: Secondary | ICD-10-CM | POA: Diagnosis not present

## 2017-01-02 DIAGNOSIS — M150 Primary generalized (osteo)arthritis: Secondary | ICD-10-CM | POA: Diagnosis not present

## 2017-01-02 DIAGNOSIS — N3000 Acute cystitis without hematuria: Secondary | ICD-10-CM | POA: Diagnosis not present

## 2017-01-05 DIAGNOSIS — N3000 Acute cystitis without hematuria: Secondary | ICD-10-CM | POA: Diagnosis not present

## 2017-01-05 DIAGNOSIS — M6281 Muscle weakness (generalized): Secondary | ICD-10-CM | POA: Diagnosis not present

## 2017-01-05 DIAGNOSIS — Z9181 History of falling: Secondary | ICD-10-CM | POA: Diagnosis not present

## 2017-01-05 DIAGNOSIS — M150 Primary generalized (osteo)arthritis: Secondary | ICD-10-CM | POA: Diagnosis not present

## 2017-01-05 DIAGNOSIS — S098XXA Other specified injuries of head, initial encounter: Secondary | ICD-10-CM | POA: Diagnosis not present

## 2017-01-07 DIAGNOSIS — N3000 Acute cystitis without hematuria: Secondary | ICD-10-CM | POA: Diagnosis not present

## 2017-01-07 DIAGNOSIS — Z9181 History of falling: Secondary | ICD-10-CM | POA: Diagnosis not present

## 2017-01-07 DIAGNOSIS — M6281 Muscle weakness (generalized): Secondary | ICD-10-CM | POA: Diagnosis not present

## 2017-01-07 DIAGNOSIS — S098XXA Other specified injuries of head, initial encounter: Secondary | ICD-10-CM | POA: Diagnosis not present

## 2017-01-07 DIAGNOSIS — M150 Primary generalized (osteo)arthritis: Secondary | ICD-10-CM | POA: Diagnosis not present

## 2017-01-08 DIAGNOSIS — Z7901 Long term (current) use of anticoagulants: Secondary | ICD-10-CM | POA: Diagnosis not present

## 2017-01-09 DIAGNOSIS — N3000 Acute cystitis without hematuria: Secondary | ICD-10-CM | POA: Diagnosis not present

## 2017-01-09 DIAGNOSIS — S098XXA Other specified injuries of head, initial encounter: Secondary | ICD-10-CM | POA: Diagnosis not present

## 2017-01-09 DIAGNOSIS — M150 Primary generalized (osteo)arthritis: Secondary | ICD-10-CM | POA: Diagnosis not present

## 2017-01-09 DIAGNOSIS — M6281 Muscle weakness (generalized): Secondary | ICD-10-CM | POA: Diagnosis not present

## 2017-01-09 DIAGNOSIS — Z9181 History of falling: Secondary | ICD-10-CM | POA: Diagnosis not present

## 2017-01-10 DIAGNOSIS — S098XXA Other specified injuries of head, initial encounter: Secondary | ICD-10-CM | POA: Diagnosis not present

## 2017-01-10 DIAGNOSIS — R269 Unspecified abnormalities of gait and mobility: Secondary | ICD-10-CM | POA: Diagnosis not present

## 2017-01-10 DIAGNOSIS — M150 Primary generalized (osteo)arthritis: Secondary | ICD-10-CM | POA: Diagnosis not present

## 2017-01-10 DIAGNOSIS — M6281 Muscle weakness (generalized): Secondary | ICD-10-CM | POA: Diagnosis not present

## 2017-01-10 DIAGNOSIS — Z9181 History of falling: Secondary | ICD-10-CM | POA: Diagnosis not present

## 2017-01-10 DIAGNOSIS — N3000 Acute cystitis without hematuria: Secondary | ICD-10-CM | POA: Diagnosis not present

## 2017-01-13 DIAGNOSIS — N3000 Acute cystitis without hematuria: Secondary | ICD-10-CM | POA: Diagnosis not present

## 2017-01-13 DIAGNOSIS — M6281 Muscle weakness (generalized): Secondary | ICD-10-CM | POA: Diagnosis not present

## 2017-01-13 DIAGNOSIS — S098XXA Other specified injuries of head, initial encounter: Secondary | ICD-10-CM | POA: Diagnosis not present

## 2017-01-13 DIAGNOSIS — Z9181 History of falling: Secondary | ICD-10-CM | POA: Diagnosis not present

## 2017-01-13 DIAGNOSIS — M150 Primary generalized (osteo)arthritis: Secondary | ICD-10-CM | POA: Diagnosis not present

## 2017-01-14 DIAGNOSIS — M150 Primary generalized (osteo)arthritis: Secondary | ICD-10-CM | POA: Diagnosis not present

## 2017-01-14 DIAGNOSIS — N3000 Acute cystitis without hematuria: Secondary | ICD-10-CM | POA: Diagnosis not present

## 2017-01-14 DIAGNOSIS — M6281 Muscle weakness (generalized): Secondary | ICD-10-CM | POA: Diagnosis not present

## 2017-01-14 DIAGNOSIS — S098XXA Other specified injuries of head, initial encounter: Secondary | ICD-10-CM | POA: Diagnosis not present

## 2017-01-14 DIAGNOSIS — Z9181 History of falling: Secondary | ICD-10-CM | POA: Diagnosis not present

## 2017-01-16 DIAGNOSIS — Z79899 Other long term (current) drug therapy: Secondary | ICD-10-CM | POA: Diagnosis not present

## 2017-01-16 DIAGNOSIS — R51 Headache: Secondary | ICD-10-CM | POA: Diagnosis not present

## 2017-01-16 DIAGNOSIS — S098XXA Other specified injuries of head, initial encounter: Secondary | ICD-10-CM | POA: Diagnosis not present

## 2017-01-16 DIAGNOSIS — K117 Disturbances of salivary secretion: Secondary | ICD-10-CM | POA: Diagnosis not present

## 2017-01-16 DIAGNOSIS — M150 Primary generalized (osteo)arthritis: Secondary | ICD-10-CM | POA: Diagnosis not present

## 2017-01-16 DIAGNOSIS — N3000 Acute cystitis without hematuria: Secondary | ICD-10-CM | POA: Diagnosis not present

## 2017-01-16 DIAGNOSIS — M6281 Muscle weakness (generalized): Secondary | ICD-10-CM | POA: Diagnosis not present

## 2017-01-16 DIAGNOSIS — R296 Repeated falls: Secondary | ICD-10-CM | POA: Diagnosis not present

## 2017-01-16 DIAGNOSIS — Z9181 History of falling: Secondary | ICD-10-CM | POA: Diagnosis not present

## 2017-01-19 DIAGNOSIS — M6281 Muscle weakness (generalized): Secondary | ICD-10-CM | POA: Diagnosis not present

## 2017-01-19 DIAGNOSIS — N3000 Acute cystitis without hematuria: Secondary | ICD-10-CM | POA: Diagnosis not present

## 2017-01-19 DIAGNOSIS — Z9181 History of falling: Secondary | ICD-10-CM | POA: Diagnosis not present

## 2017-01-19 DIAGNOSIS — S098XXA Other specified injuries of head, initial encounter: Secondary | ICD-10-CM | POA: Diagnosis not present

## 2017-01-19 DIAGNOSIS — M150 Primary generalized (osteo)arthritis: Secondary | ICD-10-CM | POA: Diagnosis not present

## 2017-01-21 DIAGNOSIS — R791 Abnormal coagulation profile: Secondary | ICD-10-CM | POA: Diagnosis not present

## 2017-01-22 DIAGNOSIS — M150 Primary generalized (osteo)arthritis: Secondary | ICD-10-CM | POA: Diagnosis not present

## 2017-01-22 DIAGNOSIS — Z9181 History of falling: Secondary | ICD-10-CM | POA: Diagnosis not present

## 2017-01-22 DIAGNOSIS — M6281 Muscle weakness (generalized): Secondary | ICD-10-CM | POA: Diagnosis not present

## 2017-01-22 DIAGNOSIS — N3000 Acute cystitis without hematuria: Secondary | ICD-10-CM | POA: Diagnosis not present

## 2017-01-22 DIAGNOSIS — S098XXA Other specified injuries of head, initial encounter: Secondary | ICD-10-CM | POA: Diagnosis not present

## 2017-02-04 DIAGNOSIS — R791 Abnormal coagulation profile: Secondary | ICD-10-CM | POA: Diagnosis not present

## 2017-02-11 DIAGNOSIS — G3184 Mild cognitive impairment, so stated: Secondary | ICD-10-CM | POA: Diagnosis not present

## 2017-02-11 DIAGNOSIS — Z79899 Other long term (current) drug therapy: Secondary | ICD-10-CM | POA: Diagnosis not present

## 2017-02-11 DIAGNOSIS — M150 Primary generalized (osteo)arthritis: Secondary | ICD-10-CM | POA: Diagnosis not present

## 2017-02-11 DIAGNOSIS — R05 Cough: Secondary | ICD-10-CM | POA: Diagnosis not present

## 2017-02-11 DIAGNOSIS — R69 Illness, unspecified: Secondary | ICD-10-CM | POA: Diagnosis not present

## 2017-02-11 DIAGNOSIS — E039 Hypothyroidism, unspecified: Secondary | ICD-10-CM | POA: Diagnosis not present

## 2017-02-11 DIAGNOSIS — M25551 Pain in right hip: Secondary | ICD-10-CM | POA: Diagnosis not present

## 2017-02-19 DIAGNOSIS — Z7901 Long term (current) use of anticoagulants: Secondary | ICD-10-CM | POA: Diagnosis not present

## 2017-03-04 DIAGNOSIS — Z7901 Long term (current) use of anticoagulants: Secondary | ICD-10-CM | POA: Diagnosis not present

## 2017-03-09 DIAGNOSIS — J209 Acute bronchitis, unspecified: Secondary | ICD-10-CM | POA: Diagnosis not present

## 2017-03-09 DIAGNOSIS — S92355A Nondisplaced fracture of fifth metatarsal bone, left foot, initial encounter for closed fracture: Secondary | ICD-10-CM | POA: Diagnosis not present

## 2017-03-09 DIAGNOSIS — M79672 Pain in left foot: Secondary | ICD-10-CM | POA: Diagnosis not present

## 2017-03-09 DIAGNOSIS — R05 Cough: Secondary | ICD-10-CM | POA: Diagnosis not present

## 2017-03-09 DIAGNOSIS — W19XXXA Unspecified fall, initial encounter: Secondary | ICD-10-CM | POA: Diagnosis not present

## 2017-03-09 DIAGNOSIS — R627 Adult failure to thrive: Secondary | ICD-10-CM | POA: Diagnosis not present

## 2017-03-10 DIAGNOSIS — S92902A Unspecified fracture of left foot, initial encounter for closed fracture: Secondary | ICD-10-CM | POA: Diagnosis not present

## 2017-03-20 DIAGNOSIS — Z7901 Long term (current) use of anticoagulants: Secondary | ICD-10-CM | POA: Diagnosis not present

## 2017-03-20 DIAGNOSIS — J9811 Atelectasis: Secondary | ICD-10-CM | POA: Diagnosis not present

## 2017-03-20 DIAGNOSIS — Z8673 Personal history of transient ischemic attack (TIA), and cerebral infarction without residual deficits: Secondary | ICD-10-CM | POA: Diagnosis not present

## 2017-03-20 DIAGNOSIS — J9601 Acute respiratory failure with hypoxia: Secondary | ICD-10-CM | POA: Diagnosis not present

## 2017-03-20 DIAGNOSIS — A419 Sepsis, unspecified organism: Secondary | ICD-10-CM | POA: Diagnosis not present

## 2017-03-20 DIAGNOSIS — J984 Other disorders of lung: Secondary | ICD-10-CM | POA: Diagnosis not present

## 2017-03-20 DIAGNOSIS — K219 Gastro-esophageal reflux disease without esophagitis: Secondary | ICD-10-CM | POA: Diagnosis not present

## 2017-03-20 DIAGNOSIS — I712 Thoracic aortic aneurysm, without rupture: Secondary | ICD-10-CM | POA: Diagnosis not present

## 2017-03-20 DIAGNOSIS — R69 Illness, unspecified: Secondary | ICD-10-CM | POA: Diagnosis not present

## 2017-03-20 DIAGNOSIS — Z792 Long term (current) use of antibiotics: Secondary | ICD-10-CM | POA: Diagnosis not present

## 2017-03-20 DIAGNOSIS — I341 Nonrheumatic mitral (valve) prolapse: Secondary | ICD-10-CM | POA: Diagnosis not present

## 2017-03-20 DIAGNOSIS — F419 Anxiety disorder, unspecified: Secondary | ICD-10-CM | POA: Diagnosis not present

## 2017-03-20 DIAGNOSIS — R918 Other nonspecific abnormal finding of lung field: Secondary | ICD-10-CM | POA: Diagnosis not present

## 2017-03-20 DIAGNOSIS — J189 Pneumonia, unspecified organism: Secondary | ICD-10-CM | POA: Diagnosis not present

## 2017-03-21 DIAGNOSIS — J9601 Acute respiratory failure with hypoxia: Secondary | ICD-10-CM | POA: Diagnosis not present

## 2017-03-21 DIAGNOSIS — Z8673 Personal history of transient ischemic attack (TIA), and cerebral infarction without residual deficits: Secondary | ICD-10-CM | POA: Diagnosis not present

## 2017-03-21 DIAGNOSIS — I712 Thoracic aortic aneurysm, without rupture: Secondary | ICD-10-CM | POA: Diagnosis not present

## 2017-03-21 DIAGNOSIS — J189 Pneumonia, unspecified organism: Secondary | ICD-10-CM | POA: Diagnosis not present

## 2017-03-21 DIAGNOSIS — K219 Gastro-esophageal reflux disease without esophagitis: Secondary | ICD-10-CM | POA: Diagnosis not present

## 2017-03-21 DIAGNOSIS — A419 Sepsis, unspecified organism: Secondary | ICD-10-CM | POA: Diagnosis not present

## 2017-03-21 DIAGNOSIS — F419 Anxiety disorder, unspecified: Secondary | ICD-10-CM | POA: Diagnosis not present

## 2017-03-21 DIAGNOSIS — I341 Nonrheumatic mitral (valve) prolapse: Secondary | ICD-10-CM | POA: Diagnosis not present

## 2017-03-21 DIAGNOSIS — R918 Other nonspecific abnormal finding of lung field: Secondary | ICD-10-CM | POA: Diagnosis not present

## 2017-03-24 DIAGNOSIS — F419 Anxiety disorder, unspecified: Secondary | ICD-10-CM | POA: Diagnosis not present

## 2017-03-24 DIAGNOSIS — I712 Thoracic aortic aneurysm, without rupture: Secondary | ICD-10-CM | POA: Diagnosis not present

## 2017-03-24 DIAGNOSIS — J189 Pneumonia, unspecified organism: Secondary | ICD-10-CM | POA: Diagnosis not present

## 2017-03-24 DIAGNOSIS — K219 Gastro-esophageal reflux disease without esophagitis: Secondary | ICD-10-CM | POA: Diagnosis not present

## 2017-03-24 DIAGNOSIS — A419 Sepsis, unspecified organism: Secondary | ICD-10-CM | POA: Diagnosis not present

## 2017-03-24 DIAGNOSIS — R69 Illness, unspecified: Secondary | ICD-10-CM | POA: Diagnosis not present

## 2017-03-24 DIAGNOSIS — J9601 Acute respiratory failure with hypoxia: Secondary | ICD-10-CM | POA: Diagnosis not present

## 2017-03-24 DIAGNOSIS — I341 Nonrheumatic mitral (valve) prolapse: Secondary | ICD-10-CM | POA: Diagnosis not present

## 2017-03-24 DIAGNOSIS — Z8673 Personal history of transient ischemic attack (TIA), and cerebral infarction without residual deficits: Secondary | ICD-10-CM | POA: Diagnosis not present

## 2017-03-25 DIAGNOSIS — M6281 Muscle weakness (generalized): Secondary | ICD-10-CM | POA: Diagnosis not present

## 2017-03-25 DIAGNOSIS — R269 Unspecified abnormalities of gait and mobility: Secondary | ICD-10-CM | POA: Diagnosis not present

## 2017-03-25 DIAGNOSIS — T85511A Breakdown (mechanical) of esophageal anti-reflux device, initial encounter: Secondary | ICD-10-CM | POA: Diagnosis not present

## 2017-03-25 DIAGNOSIS — J189 Pneumonia, unspecified organism: Secondary | ICD-10-CM | POA: Diagnosis not present

## 2017-03-25 DIAGNOSIS — I481 Persistent atrial fibrillation: Secondary | ICD-10-CM | POA: Diagnosis not present

## 2017-03-25 DIAGNOSIS — J441 Chronic obstructive pulmonary disease with (acute) exacerbation: Secondary | ICD-10-CM | POA: Diagnosis not present

## 2017-03-25 DIAGNOSIS — J430 Unilateral pulmonary emphysema [MacLeod's syndrome]: Secondary | ICD-10-CM | POA: Diagnosis not present

## 2017-03-26 DIAGNOSIS — M6281 Muscle weakness (generalized): Secondary | ICD-10-CM | POA: Diagnosis not present

## 2017-03-26 DIAGNOSIS — T85511A Breakdown (mechanical) of esophageal anti-reflux device, initial encounter: Secondary | ICD-10-CM | POA: Diagnosis not present

## 2017-03-26 DIAGNOSIS — I481 Persistent atrial fibrillation: Secondary | ICD-10-CM | POA: Diagnosis not present

## 2017-03-26 DIAGNOSIS — R269 Unspecified abnormalities of gait and mobility: Secondary | ICD-10-CM | POA: Diagnosis not present

## 2017-03-26 DIAGNOSIS — J430 Unilateral pulmonary emphysema [MacLeod's syndrome]: Secondary | ICD-10-CM | POA: Diagnosis not present

## 2017-03-27 DIAGNOSIS — T85511A Breakdown (mechanical) of esophageal anti-reflux device, initial encounter: Secondary | ICD-10-CM | POA: Diagnosis not present

## 2017-03-27 DIAGNOSIS — J441 Chronic obstructive pulmonary disease with (acute) exacerbation: Secondary | ICD-10-CM | POA: Diagnosis not present

## 2017-03-27 DIAGNOSIS — J189 Pneumonia, unspecified organism: Secondary | ICD-10-CM | POA: Diagnosis not present

## 2017-03-27 DIAGNOSIS — R269 Unspecified abnormalities of gait and mobility: Secondary | ICD-10-CM | POA: Diagnosis not present

## 2017-03-27 DIAGNOSIS — I481 Persistent atrial fibrillation: Secondary | ICD-10-CM | POA: Diagnosis not present

## 2017-03-30 DIAGNOSIS — R269 Unspecified abnormalities of gait and mobility: Secondary | ICD-10-CM | POA: Diagnosis not present

## 2017-03-30 DIAGNOSIS — I481 Persistent atrial fibrillation: Secondary | ICD-10-CM | POA: Diagnosis not present

## 2017-03-30 DIAGNOSIS — J441 Chronic obstructive pulmonary disease with (acute) exacerbation: Secondary | ICD-10-CM | POA: Diagnosis not present

## 2017-03-30 DIAGNOSIS — T85511A Breakdown (mechanical) of esophageal anti-reflux device, initial encounter: Secondary | ICD-10-CM | POA: Diagnosis not present

## 2017-03-30 DIAGNOSIS — J189 Pneumonia, unspecified organism: Secondary | ICD-10-CM | POA: Diagnosis not present

## 2017-03-31 DIAGNOSIS — T85511A Breakdown (mechanical) of esophageal anti-reflux device, initial encounter: Secondary | ICD-10-CM | POA: Diagnosis not present

## 2017-03-31 DIAGNOSIS — J189 Pneumonia, unspecified organism: Secondary | ICD-10-CM | POA: Diagnosis not present

## 2017-03-31 DIAGNOSIS — I481 Persistent atrial fibrillation: Secondary | ICD-10-CM | POA: Diagnosis not present

## 2017-03-31 DIAGNOSIS — J441 Chronic obstructive pulmonary disease with (acute) exacerbation: Secondary | ICD-10-CM | POA: Diagnosis not present

## 2017-03-31 DIAGNOSIS — R269 Unspecified abnormalities of gait and mobility: Secondary | ICD-10-CM | POA: Diagnosis not present

## 2017-04-01 DIAGNOSIS — M6281 Muscle weakness (generalized): Secondary | ICD-10-CM | POA: Diagnosis not present

## 2017-04-01 DIAGNOSIS — T85511A Breakdown (mechanical) of esophageal anti-reflux device, initial encounter: Secondary | ICD-10-CM | POA: Diagnosis not present

## 2017-04-01 DIAGNOSIS — J4520 Mild intermittent asthma, uncomplicated: Secondary | ICD-10-CM | POA: Diagnosis not present

## 2017-04-01 DIAGNOSIS — Z8701 Personal history of pneumonia (recurrent): Secondary | ICD-10-CM | POA: Diagnosis not present

## 2017-04-01 DIAGNOSIS — I481 Persistent atrial fibrillation: Secondary | ICD-10-CM | POA: Diagnosis not present

## 2017-04-01 DIAGNOSIS — J441 Chronic obstructive pulmonary disease with (acute) exacerbation: Secondary | ICD-10-CM | POA: Diagnosis not present

## 2017-04-01 DIAGNOSIS — J189 Pneumonia, unspecified organism: Secondary | ICD-10-CM | POA: Diagnosis not present

## 2017-04-01 DIAGNOSIS — M549 Dorsalgia, unspecified: Secondary | ICD-10-CM | POA: Diagnosis not present

## 2017-04-01 DIAGNOSIS — R269 Unspecified abnormalities of gait and mobility: Secondary | ICD-10-CM | POA: Diagnosis not present

## 2017-04-01 DIAGNOSIS — R69 Illness, unspecified: Secondary | ICD-10-CM | POA: Diagnosis not present

## 2017-04-01 DIAGNOSIS — J430 Unilateral pulmonary emphysema [MacLeod's syndrome]: Secondary | ICD-10-CM | POA: Diagnosis not present

## 2017-04-01 DIAGNOSIS — Z7901 Long term (current) use of anticoagulants: Secondary | ICD-10-CM | POA: Diagnosis not present

## 2017-04-02 DIAGNOSIS — I481 Persistent atrial fibrillation: Secondary | ICD-10-CM | POA: Diagnosis not present

## 2017-04-02 DIAGNOSIS — R269 Unspecified abnormalities of gait and mobility: Secondary | ICD-10-CM | POA: Diagnosis not present

## 2017-04-02 DIAGNOSIS — T85511A Breakdown (mechanical) of esophageal anti-reflux device, initial encounter: Secondary | ICD-10-CM | POA: Diagnosis not present

## 2017-04-02 DIAGNOSIS — J441 Chronic obstructive pulmonary disease with (acute) exacerbation: Secondary | ICD-10-CM | POA: Diagnosis not present

## 2017-04-02 DIAGNOSIS — J189 Pneumonia, unspecified organism: Secondary | ICD-10-CM | POA: Diagnosis not present

## 2017-04-03 DIAGNOSIS — I481 Persistent atrial fibrillation: Secondary | ICD-10-CM | POA: Diagnosis not present

## 2017-04-03 DIAGNOSIS — J189 Pneumonia, unspecified organism: Secondary | ICD-10-CM | POA: Diagnosis not present

## 2017-04-03 DIAGNOSIS — J441 Chronic obstructive pulmonary disease with (acute) exacerbation: Secondary | ICD-10-CM | POA: Diagnosis not present

## 2017-04-03 DIAGNOSIS — T85511A Breakdown (mechanical) of esophageal anti-reflux device, initial encounter: Secondary | ICD-10-CM | POA: Diagnosis not present

## 2017-04-03 DIAGNOSIS — R269 Unspecified abnormalities of gait and mobility: Secondary | ICD-10-CM | POA: Diagnosis not present

## 2017-04-06 DIAGNOSIS — J189 Pneumonia, unspecified organism: Secondary | ICD-10-CM | POA: Diagnosis not present

## 2017-04-06 DIAGNOSIS — R269 Unspecified abnormalities of gait and mobility: Secondary | ICD-10-CM | POA: Diagnosis not present

## 2017-04-06 DIAGNOSIS — J441 Chronic obstructive pulmonary disease with (acute) exacerbation: Secondary | ICD-10-CM | POA: Diagnosis not present

## 2017-04-06 DIAGNOSIS — T85511A Breakdown (mechanical) of esophageal anti-reflux device, initial encounter: Secondary | ICD-10-CM | POA: Diagnosis not present

## 2017-04-06 DIAGNOSIS — I481 Persistent atrial fibrillation: Secondary | ICD-10-CM | POA: Diagnosis not present

## 2017-04-07 DIAGNOSIS — J441 Chronic obstructive pulmonary disease with (acute) exacerbation: Secondary | ICD-10-CM | POA: Diagnosis not present

## 2017-04-07 DIAGNOSIS — J189 Pneumonia, unspecified organism: Secondary | ICD-10-CM | POA: Diagnosis not present

## 2017-04-07 DIAGNOSIS — L89150 Pressure ulcer of sacral region, unstageable: Secondary | ICD-10-CM | POA: Diagnosis not present

## 2017-04-07 DIAGNOSIS — I481 Persistent atrial fibrillation: Secondary | ICD-10-CM | POA: Diagnosis not present

## 2017-04-07 DIAGNOSIS — T85511A Breakdown (mechanical) of esophageal anti-reflux device, initial encounter: Secondary | ICD-10-CM | POA: Diagnosis not present

## 2017-04-07 DIAGNOSIS — R269 Unspecified abnormalities of gait and mobility: Secondary | ICD-10-CM | POA: Diagnosis not present

## 2017-04-08 DIAGNOSIS — R269 Unspecified abnormalities of gait and mobility: Secondary | ICD-10-CM | POA: Diagnosis not present

## 2017-04-08 DIAGNOSIS — J441 Chronic obstructive pulmonary disease with (acute) exacerbation: Secondary | ICD-10-CM | POA: Diagnosis not present

## 2017-04-08 DIAGNOSIS — J189 Pneumonia, unspecified organism: Secondary | ICD-10-CM | POA: Diagnosis not present

## 2017-04-08 DIAGNOSIS — I481 Persistent atrial fibrillation: Secondary | ICD-10-CM | POA: Diagnosis not present

## 2017-04-08 DIAGNOSIS — T85511A Breakdown (mechanical) of esophageal anti-reflux device, initial encounter: Secondary | ICD-10-CM | POA: Diagnosis not present

## 2017-04-10 DIAGNOSIS — J189 Pneumonia, unspecified organism: Secondary | ICD-10-CM | POA: Diagnosis not present

## 2017-04-10 DIAGNOSIS — J441 Chronic obstructive pulmonary disease with (acute) exacerbation: Secondary | ICD-10-CM | POA: Diagnosis not present

## 2017-04-10 DIAGNOSIS — R269 Unspecified abnormalities of gait and mobility: Secondary | ICD-10-CM | POA: Diagnosis not present

## 2017-04-10 DIAGNOSIS — I481 Persistent atrial fibrillation: Secondary | ICD-10-CM | POA: Diagnosis not present

## 2017-04-10 DIAGNOSIS — T85511A Breakdown (mechanical) of esophageal anti-reflux device, initial encounter: Secondary | ICD-10-CM | POA: Diagnosis not present

## 2017-04-13 DIAGNOSIS — J441 Chronic obstructive pulmonary disease with (acute) exacerbation: Secondary | ICD-10-CM | POA: Diagnosis not present

## 2017-04-13 DIAGNOSIS — I481 Persistent atrial fibrillation: Secondary | ICD-10-CM | POA: Diagnosis not present

## 2017-04-13 DIAGNOSIS — T85511A Breakdown (mechanical) of esophageal anti-reflux device, initial encounter: Secondary | ICD-10-CM | POA: Diagnosis not present

## 2017-04-13 DIAGNOSIS — R269 Unspecified abnormalities of gait and mobility: Secondary | ICD-10-CM | POA: Diagnosis not present

## 2017-04-13 DIAGNOSIS — J189 Pneumonia, unspecified organism: Secondary | ICD-10-CM | POA: Diagnosis not present

## 2017-04-14 DIAGNOSIS — J189 Pneumonia, unspecified organism: Secondary | ICD-10-CM | POA: Diagnosis not present

## 2017-04-14 DIAGNOSIS — T85511A Breakdown (mechanical) of esophageal anti-reflux device, initial encounter: Secondary | ICD-10-CM | POA: Diagnosis not present

## 2017-04-14 DIAGNOSIS — R269 Unspecified abnormalities of gait and mobility: Secondary | ICD-10-CM | POA: Diagnosis not present

## 2017-04-14 DIAGNOSIS — J441 Chronic obstructive pulmonary disease with (acute) exacerbation: Secondary | ICD-10-CM | POA: Diagnosis not present

## 2017-04-14 DIAGNOSIS — I481 Persistent atrial fibrillation: Secondary | ICD-10-CM | POA: Diagnosis not present

## 2017-04-15 DIAGNOSIS — J441 Chronic obstructive pulmonary disease with (acute) exacerbation: Secondary | ICD-10-CM | POA: Diagnosis not present

## 2017-04-15 DIAGNOSIS — I481 Persistent atrial fibrillation: Secondary | ICD-10-CM | POA: Diagnosis not present

## 2017-04-15 DIAGNOSIS — T85511A Breakdown (mechanical) of esophageal anti-reflux device, initial encounter: Secondary | ICD-10-CM | POA: Diagnosis not present

## 2017-04-15 DIAGNOSIS — R269 Unspecified abnormalities of gait and mobility: Secondary | ICD-10-CM | POA: Diagnosis not present

## 2017-04-15 DIAGNOSIS — J189 Pneumonia, unspecified organism: Secondary | ICD-10-CM | POA: Diagnosis not present

## 2017-04-16 DIAGNOSIS — H401231 Low-tension glaucoma, bilateral, mild stage: Secondary | ICD-10-CM | POA: Diagnosis not present

## 2017-04-16 DIAGNOSIS — H43812 Vitreous degeneration, left eye: Secondary | ICD-10-CM | POA: Diagnosis not present

## 2017-04-16 DIAGNOSIS — H47022 Hemorrhage in optic nerve sheath, left eye: Secondary | ICD-10-CM | POA: Diagnosis not present

## 2017-04-16 DIAGNOSIS — Z961 Presence of intraocular lens: Secondary | ICD-10-CM | POA: Diagnosis not present

## 2017-04-17 DIAGNOSIS — J189 Pneumonia, unspecified organism: Secondary | ICD-10-CM | POA: Diagnosis not present

## 2017-04-17 DIAGNOSIS — R269 Unspecified abnormalities of gait and mobility: Secondary | ICD-10-CM | POA: Diagnosis not present

## 2017-04-17 DIAGNOSIS — I481 Persistent atrial fibrillation: Secondary | ICD-10-CM | POA: Diagnosis not present

## 2017-04-17 DIAGNOSIS — J441 Chronic obstructive pulmonary disease with (acute) exacerbation: Secondary | ICD-10-CM | POA: Diagnosis not present

## 2017-04-17 DIAGNOSIS — T85511A Breakdown (mechanical) of esophageal anti-reflux device, initial encounter: Secondary | ICD-10-CM | POA: Diagnosis not present

## 2017-04-20 DIAGNOSIS — R269 Unspecified abnormalities of gait and mobility: Secondary | ICD-10-CM | POA: Diagnosis not present

## 2017-04-20 DIAGNOSIS — I481 Persistent atrial fibrillation: Secondary | ICD-10-CM | POA: Diagnosis not present

## 2017-04-20 DIAGNOSIS — J441 Chronic obstructive pulmonary disease with (acute) exacerbation: Secondary | ICD-10-CM | POA: Diagnosis not present

## 2017-04-20 DIAGNOSIS — J189 Pneumonia, unspecified organism: Secondary | ICD-10-CM | POA: Diagnosis not present

## 2017-04-20 DIAGNOSIS — T85511A Breakdown (mechanical) of esophageal anti-reflux device, initial encounter: Secondary | ICD-10-CM | POA: Diagnosis not present

## 2017-04-22 DIAGNOSIS — J441 Chronic obstructive pulmonary disease with (acute) exacerbation: Secondary | ICD-10-CM | POA: Diagnosis not present

## 2017-04-22 DIAGNOSIS — I481 Persistent atrial fibrillation: Secondary | ICD-10-CM | POA: Diagnosis not present

## 2017-04-22 DIAGNOSIS — J189 Pneumonia, unspecified organism: Secondary | ICD-10-CM | POA: Diagnosis not present

## 2017-04-22 DIAGNOSIS — J4520 Mild intermittent asthma, uncomplicated: Secondary | ICD-10-CM | POA: Diagnosis not present

## 2017-04-22 DIAGNOSIS — T85511A Breakdown (mechanical) of esophageal anti-reflux device, initial encounter: Secondary | ICD-10-CM | POA: Diagnosis not present

## 2017-04-22 DIAGNOSIS — R269 Unspecified abnormalities of gait and mobility: Secondary | ICD-10-CM | POA: Diagnosis not present

## 2017-04-24 DIAGNOSIS — R269 Unspecified abnormalities of gait and mobility: Secondary | ICD-10-CM | POA: Diagnosis not present

## 2017-04-24 DIAGNOSIS — T85511A Breakdown (mechanical) of esophageal anti-reflux device, initial encounter: Secondary | ICD-10-CM | POA: Diagnosis not present

## 2017-04-24 DIAGNOSIS — J189 Pneumonia, unspecified organism: Secondary | ICD-10-CM | POA: Diagnosis not present

## 2017-04-24 DIAGNOSIS — J441 Chronic obstructive pulmonary disease with (acute) exacerbation: Secondary | ICD-10-CM | POA: Diagnosis not present

## 2017-04-24 DIAGNOSIS — I481 Persistent atrial fibrillation: Secondary | ICD-10-CM | POA: Diagnosis not present

## 2017-04-27 DIAGNOSIS — R269 Unspecified abnormalities of gait and mobility: Secondary | ICD-10-CM | POA: Diagnosis not present

## 2017-04-27 DIAGNOSIS — I481 Persistent atrial fibrillation: Secondary | ICD-10-CM | POA: Diagnosis not present

## 2017-04-27 DIAGNOSIS — J189 Pneumonia, unspecified organism: Secondary | ICD-10-CM | POA: Diagnosis not present

## 2017-04-27 DIAGNOSIS — T85511A Breakdown (mechanical) of esophageal anti-reflux device, initial encounter: Secondary | ICD-10-CM | POA: Diagnosis not present

## 2017-04-27 DIAGNOSIS — J441 Chronic obstructive pulmonary disease with (acute) exacerbation: Secondary | ICD-10-CM | POA: Diagnosis not present

## 2017-04-29 DIAGNOSIS — J441 Chronic obstructive pulmonary disease with (acute) exacerbation: Secondary | ICD-10-CM | POA: Diagnosis not present

## 2017-04-29 DIAGNOSIS — J189 Pneumonia, unspecified organism: Secondary | ICD-10-CM | POA: Diagnosis not present

## 2017-04-29 DIAGNOSIS — I481 Persistent atrial fibrillation: Secondary | ICD-10-CM | POA: Diagnosis not present

## 2017-04-29 DIAGNOSIS — R269 Unspecified abnormalities of gait and mobility: Secondary | ICD-10-CM | POA: Diagnosis not present

## 2017-04-29 DIAGNOSIS — T85511A Breakdown (mechanical) of esophageal anti-reflux device, initial encounter: Secondary | ICD-10-CM | POA: Diagnosis not present

## 2017-04-30 DIAGNOSIS — R269 Unspecified abnormalities of gait and mobility: Secondary | ICD-10-CM | POA: Diagnosis not present

## 2017-04-30 DIAGNOSIS — J441 Chronic obstructive pulmonary disease with (acute) exacerbation: Secondary | ICD-10-CM | POA: Diagnosis not present

## 2017-04-30 DIAGNOSIS — T85511A Breakdown (mechanical) of esophageal anti-reflux device, initial encounter: Secondary | ICD-10-CM | POA: Diagnosis not present

## 2017-04-30 DIAGNOSIS — J189 Pneumonia, unspecified organism: Secondary | ICD-10-CM | POA: Diagnosis not present

## 2017-04-30 DIAGNOSIS — I481 Persistent atrial fibrillation: Secondary | ICD-10-CM | POA: Diagnosis not present

## 2017-05-04 DIAGNOSIS — I481 Persistent atrial fibrillation: Secondary | ICD-10-CM | POA: Diagnosis not present

## 2017-05-04 DIAGNOSIS — J189 Pneumonia, unspecified organism: Secondary | ICD-10-CM | POA: Diagnosis not present

## 2017-05-04 DIAGNOSIS — T85511A Breakdown (mechanical) of esophageal anti-reflux device, initial encounter: Secondary | ICD-10-CM | POA: Diagnosis not present

## 2017-05-04 DIAGNOSIS — R269 Unspecified abnormalities of gait and mobility: Secondary | ICD-10-CM | POA: Diagnosis not present

## 2017-05-04 DIAGNOSIS — J441 Chronic obstructive pulmonary disease with (acute) exacerbation: Secondary | ICD-10-CM | POA: Diagnosis not present

## 2017-05-07 DIAGNOSIS — T85511A Breakdown (mechanical) of esophageal anti-reflux device, initial encounter: Secondary | ICD-10-CM | POA: Diagnosis not present

## 2017-05-07 DIAGNOSIS — J189 Pneumonia, unspecified organism: Secondary | ICD-10-CM | POA: Diagnosis not present

## 2017-05-07 DIAGNOSIS — I481 Persistent atrial fibrillation: Secondary | ICD-10-CM | POA: Diagnosis not present

## 2017-05-07 DIAGNOSIS — J441 Chronic obstructive pulmonary disease with (acute) exacerbation: Secondary | ICD-10-CM | POA: Diagnosis not present

## 2017-05-07 DIAGNOSIS — R269 Unspecified abnormalities of gait and mobility: Secondary | ICD-10-CM | POA: Diagnosis not present

## 2017-05-20 DIAGNOSIS — J4520 Mild intermittent asthma, uncomplicated: Secondary | ICD-10-CM | POA: Diagnosis not present

## 2017-06-03 DIAGNOSIS — R748 Abnormal levels of other serum enzymes: Secondary | ICD-10-CM | POA: Diagnosis not present

## 2017-06-03 DIAGNOSIS — Z7901 Long term (current) use of anticoagulants: Secondary | ICD-10-CM | POA: Diagnosis not present

## 2017-06-17 DIAGNOSIS — E039 Hypothyroidism, unspecified: Secondary | ICD-10-CM | POA: Diagnosis not present

## 2017-06-17 DIAGNOSIS — M150 Primary generalized (osteo)arthritis: Secondary | ICD-10-CM | POA: Diagnosis not present

## 2017-06-17 DIAGNOSIS — R69 Illness, unspecified: Secondary | ICD-10-CM | POA: Diagnosis not present

## 2017-06-17 DIAGNOSIS — J4520 Mild intermittent asthma, uncomplicated: Secondary | ICD-10-CM | POA: Diagnosis not present

## 2017-06-17 DIAGNOSIS — Z8701 Personal history of pneumonia (recurrent): Secondary | ICD-10-CM | POA: Diagnosis not present

## 2017-06-17 DIAGNOSIS — Z7901 Long term (current) use of anticoagulants: Secondary | ICD-10-CM | POA: Diagnosis not present

## 2017-06-20 DIAGNOSIS — J4520 Mild intermittent asthma, uncomplicated: Secondary | ICD-10-CM | POA: Diagnosis not present

## 2017-07-01 DIAGNOSIS — R791 Abnormal coagulation profile: Secondary | ICD-10-CM | POA: Diagnosis not present

## 2017-07-09 DIAGNOSIS — N390 Urinary tract infection, site not specified: Secondary | ICD-10-CM | POA: Diagnosis not present

## 2017-07-15 DIAGNOSIS — Z7901 Long term (current) use of anticoagulants: Secondary | ICD-10-CM | POA: Diagnosis not present

## 2017-07-20 DIAGNOSIS — J4520 Mild intermittent asthma, uncomplicated: Secondary | ICD-10-CM | POA: Diagnosis not present

## 2017-07-23 DIAGNOSIS — R0602 Shortness of breath: Secondary | ICD-10-CM | POA: Diagnosis not present

## 2017-07-23 DIAGNOSIS — Z8701 Personal history of pneumonia (recurrent): Secondary | ICD-10-CM | POA: Diagnosis not present

## 2017-07-28 DIAGNOSIS — Z7901 Long term (current) use of anticoagulants: Secondary | ICD-10-CM | POA: Diagnosis not present

## 2017-08-20 DIAGNOSIS — J4520 Mild intermittent asthma, uncomplicated: Secondary | ICD-10-CM | POA: Diagnosis not present

## 2017-08-26 DIAGNOSIS — Z7901 Long term (current) use of anticoagulants: Secondary | ICD-10-CM | POA: Diagnosis not present

## 2017-09-19 DIAGNOSIS — J4520 Mild intermittent asthma, uncomplicated: Secondary | ICD-10-CM | POA: Diagnosis not present

## 2017-09-23 DIAGNOSIS — G309 Alzheimer's disease, unspecified: Secondary | ICD-10-CM | POA: Diagnosis not present

## 2017-09-23 DIAGNOSIS — J45909 Unspecified asthma, uncomplicated: Secondary | ICD-10-CM | POA: Diagnosis not present

## 2017-09-23 DIAGNOSIS — H547 Unspecified visual loss: Secondary | ICD-10-CM | POA: Diagnosis not present

## 2017-09-23 DIAGNOSIS — M1990 Unspecified osteoarthritis, unspecified site: Secondary | ICD-10-CM | POA: Diagnosis not present

## 2017-09-23 DIAGNOSIS — F419 Anxiety disorder, unspecified: Secondary | ICD-10-CM | POA: Diagnosis not present

## 2017-09-23 DIAGNOSIS — F4321 Adjustment disorder with depressed mood: Secondary | ICD-10-CM | POA: Diagnosis not present

## 2017-09-23 DIAGNOSIS — R69 Illness, unspecified: Secondary | ICD-10-CM | POA: Diagnosis not present

## 2017-09-23 DIAGNOSIS — G8929 Other chronic pain: Secondary | ICD-10-CM | POA: Diagnosis not present

## 2017-09-23 DIAGNOSIS — G4700 Insomnia, unspecified: Secondary | ICD-10-CM | POA: Diagnosis not present

## 2017-09-23 DIAGNOSIS — H269 Unspecified cataract: Secondary | ICD-10-CM | POA: Diagnosis not present

## 2017-09-24 DIAGNOSIS — H04123 Dry eye syndrome of bilateral lacrimal glands: Secondary | ICD-10-CM | POA: Diagnosis not present

## 2017-09-24 DIAGNOSIS — H5712 Ocular pain, left eye: Secondary | ICD-10-CM | POA: Diagnosis not present

## 2017-09-24 DIAGNOSIS — H401231 Low-tension glaucoma, bilateral, mild stage: Secondary | ICD-10-CM | POA: Diagnosis not present

## 2017-09-24 DIAGNOSIS — H1851 Endothelial corneal dystrophy: Secondary | ICD-10-CM | POA: Diagnosis not present

## 2017-09-30 DIAGNOSIS — M150 Primary generalized (osteo)arthritis: Secondary | ICD-10-CM | POA: Diagnosis not present

## 2017-09-30 DIAGNOSIS — Z7901 Long term (current) use of anticoagulants: Secondary | ICD-10-CM | POA: Diagnosis not present

## 2017-10-20 DIAGNOSIS — J4520 Mild intermittent asthma, uncomplicated: Secondary | ICD-10-CM | POA: Diagnosis not present

## 2017-10-22 DIAGNOSIS — Z23 Encounter for immunization: Secondary | ICD-10-CM | POA: Diagnosis not present

## 2017-10-22 DIAGNOSIS — L304 Erythema intertrigo: Secondary | ICD-10-CM | POA: Diagnosis not present

## 2017-10-22 DIAGNOSIS — R531 Weakness: Secondary | ICD-10-CM | POA: Diagnosis not present

## 2017-10-22 DIAGNOSIS — Z0001 Encounter for general adult medical examination with abnormal findings: Secondary | ICD-10-CM | POA: Diagnosis not present

## 2017-10-22 DIAGNOSIS — Z7189 Other specified counseling: Secondary | ICD-10-CM | POA: Diagnosis not present

## 2017-10-22 DIAGNOSIS — R51 Headache: Secondary | ICD-10-CM | POA: Diagnosis not present

## 2017-10-22 DIAGNOSIS — Z1389 Encounter for screening for other disorder: Secondary | ICD-10-CM | POA: Diagnosis not present

## 2017-10-22 DIAGNOSIS — Z1331 Encounter for screening for depression: Secondary | ICD-10-CM | POA: Diagnosis not present

## 2017-10-22 DIAGNOSIS — R300 Dysuria: Secondary | ICD-10-CM | POA: Diagnosis not present

## 2017-10-22 DIAGNOSIS — R110 Nausea: Secondary | ICD-10-CM | POA: Diagnosis not present

## 2017-10-22 DIAGNOSIS — M25551 Pain in right hip: Secondary | ICD-10-CM | POA: Diagnosis not present

## 2017-11-05 DIAGNOSIS — R531 Weakness: Secondary | ICD-10-CM | POA: Diagnosis not present

## 2017-11-05 DIAGNOSIS — Z7901 Long term (current) use of anticoagulants: Secondary | ICD-10-CM | POA: Diagnosis not present

## 2017-11-05 DIAGNOSIS — M150 Primary generalized (osteo)arthritis: Secondary | ICD-10-CM | POA: Diagnosis not present

## 2017-11-19 DIAGNOSIS — N390 Urinary tract infection, site not specified: Secondary | ICD-10-CM | POA: Diagnosis not present

## 2017-11-20 DIAGNOSIS — J4520 Mild intermittent asthma, uncomplicated: Secondary | ICD-10-CM | POA: Diagnosis not present

## 2017-12-03 DIAGNOSIS — G8929 Other chronic pain: Secondary | ICD-10-CM | POA: Diagnosis not present

## 2017-12-03 DIAGNOSIS — Z7901 Long term (current) use of anticoagulants: Secondary | ICD-10-CM | POA: Diagnosis not present

## 2017-12-03 DIAGNOSIS — M797 Fibromyalgia: Secondary | ICD-10-CM | POA: Diagnosis not present

## 2017-12-03 DIAGNOSIS — R69 Illness, unspecified: Secondary | ICD-10-CM | POA: Diagnosis not present

## 2017-12-03 DIAGNOSIS — Z79899 Other long term (current) drug therapy: Secondary | ICD-10-CM | POA: Diagnosis not present

## 2017-12-03 DIAGNOSIS — M25551 Pain in right hip: Secondary | ICD-10-CM | POA: Diagnosis not present

## 2017-12-20 DIAGNOSIS — J4520 Mild intermittent asthma, uncomplicated: Secondary | ICD-10-CM | POA: Diagnosis not present

## 2018-01-06 DIAGNOSIS — Z8673 Personal history of transient ischemic attack (TIA), and cerebral infarction without residual deficits: Secondary | ICD-10-CM | POA: Diagnosis not present

## 2018-01-06 DIAGNOSIS — Z86711 Personal history of pulmonary embolism: Secondary | ICD-10-CM | POA: Diagnosis not present

## 2018-01-06 DIAGNOSIS — R531 Weakness: Secondary | ICD-10-CM | POA: Diagnosis not present

## 2018-01-06 DIAGNOSIS — I341 Nonrheumatic mitral (valve) prolapse: Secondary | ICD-10-CM | POA: Diagnosis not present

## 2018-01-06 DIAGNOSIS — M150 Primary generalized (osteo)arthritis: Secondary | ICD-10-CM | POA: Diagnosis not present

## 2018-01-06 DIAGNOSIS — R296 Repeated falls: Secondary | ICD-10-CM | POA: Diagnosis not present

## 2018-01-06 DIAGNOSIS — J449 Chronic obstructive pulmonary disease, unspecified: Secondary | ICD-10-CM | POA: Diagnosis not present

## 2018-01-06 DIAGNOSIS — R69 Illness, unspecified: Secondary | ICD-10-CM | POA: Diagnosis not present

## 2018-01-06 DIAGNOSIS — E039 Hypothyroidism, unspecified: Secondary | ICD-10-CM | POA: Diagnosis not present

## 2018-01-06 DIAGNOSIS — M549 Dorsalgia, unspecified: Secondary | ICD-10-CM | POA: Diagnosis not present

## 2018-01-07 DIAGNOSIS — Z86711 Personal history of pulmonary embolism: Secondary | ICD-10-CM | POA: Diagnosis not present

## 2018-01-07 DIAGNOSIS — E039 Hypothyroidism, unspecified: Secondary | ICD-10-CM | POA: Diagnosis not present

## 2018-01-07 DIAGNOSIS — M150 Primary generalized (osteo)arthritis: Secondary | ICD-10-CM | POA: Diagnosis not present

## 2018-01-07 DIAGNOSIS — Z8673 Personal history of transient ischemic attack (TIA), and cerebral infarction without residual deficits: Secondary | ICD-10-CM | POA: Diagnosis not present

## 2018-01-07 DIAGNOSIS — J449 Chronic obstructive pulmonary disease, unspecified: Secondary | ICD-10-CM | POA: Diagnosis not present

## 2018-01-07 DIAGNOSIS — M549 Dorsalgia, unspecified: Secondary | ICD-10-CM | POA: Diagnosis not present

## 2018-01-07 DIAGNOSIS — I341 Nonrheumatic mitral (valve) prolapse: Secondary | ICD-10-CM | POA: Diagnosis not present

## 2018-01-07 DIAGNOSIS — R296 Repeated falls: Secondary | ICD-10-CM | POA: Diagnosis not present

## 2018-01-07 DIAGNOSIS — R531 Weakness: Secondary | ICD-10-CM | POA: Diagnosis not present

## 2018-01-07 DIAGNOSIS — R69 Illness, unspecified: Secondary | ICD-10-CM | POA: Diagnosis not present

## 2018-01-08 DIAGNOSIS — R5383 Other fatigue: Secondary | ICD-10-CM | POA: Diagnosis not present

## 2018-01-08 DIAGNOSIS — E441 Mild protein-calorie malnutrition: Secondary | ICD-10-CM | POA: Diagnosis not present

## 2018-01-08 DIAGNOSIS — M549 Dorsalgia, unspecified: Secondary | ICD-10-CM | POA: Diagnosis not present

## 2018-01-08 DIAGNOSIS — H9201 Otalgia, right ear: Secondary | ICD-10-CM | POA: Diagnosis not present

## 2018-01-08 DIAGNOSIS — Z7901 Long term (current) use of anticoagulants: Secondary | ICD-10-CM | POA: Diagnosis not present

## 2018-01-11 DIAGNOSIS — J449 Chronic obstructive pulmonary disease, unspecified: Secondary | ICD-10-CM | POA: Diagnosis not present

## 2018-01-11 DIAGNOSIS — Z8673 Personal history of transient ischemic attack (TIA), and cerebral infarction without residual deficits: Secondary | ICD-10-CM | POA: Diagnosis not present

## 2018-01-11 DIAGNOSIS — I341 Nonrheumatic mitral (valve) prolapse: Secondary | ICD-10-CM | POA: Diagnosis not present

## 2018-01-11 DIAGNOSIS — R531 Weakness: Secondary | ICD-10-CM | POA: Diagnosis not present

## 2018-01-11 DIAGNOSIS — M150 Primary generalized (osteo)arthritis: Secondary | ICD-10-CM | POA: Diagnosis not present

## 2018-01-11 DIAGNOSIS — E039 Hypothyroidism, unspecified: Secondary | ICD-10-CM | POA: Diagnosis not present

## 2018-01-11 DIAGNOSIS — R69 Illness, unspecified: Secondary | ICD-10-CM | POA: Diagnosis not present

## 2018-01-11 DIAGNOSIS — M549 Dorsalgia, unspecified: Secondary | ICD-10-CM | POA: Diagnosis not present

## 2018-01-11 DIAGNOSIS — Z86711 Personal history of pulmonary embolism: Secondary | ICD-10-CM | POA: Diagnosis not present

## 2018-01-11 DIAGNOSIS — R296 Repeated falls: Secondary | ICD-10-CM | POA: Diagnosis not present

## 2018-01-13 DIAGNOSIS — J449 Chronic obstructive pulmonary disease, unspecified: Secondary | ICD-10-CM | POA: Diagnosis not present

## 2018-01-13 DIAGNOSIS — R296 Repeated falls: Secondary | ICD-10-CM | POA: Diagnosis not present

## 2018-01-13 DIAGNOSIS — E039 Hypothyroidism, unspecified: Secondary | ICD-10-CM | POA: Diagnosis not present

## 2018-01-13 DIAGNOSIS — M549 Dorsalgia, unspecified: Secondary | ICD-10-CM | POA: Diagnosis not present

## 2018-01-13 DIAGNOSIS — R69 Illness, unspecified: Secondary | ICD-10-CM | POA: Diagnosis not present

## 2018-01-13 DIAGNOSIS — R531 Weakness: Secondary | ICD-10-CM | POA: Diagnosis not present

## 2018-01-13 DIAGNOSIS — M150 Primary generalized (osteo)arthritis: Secondary | ICD-10-CM | POA: Diagnosis not present

## 2018-01-13 DIAGNOSIS — I341 Nonrheumatic mitral (valve) prolapse: Secondary | ICD-10-CM | POA: Diagnosis not present

## 2018-01-13 DIAGNOSIS — Z86711 Personal history of pulmonary embolism: Secondary | ICD-10-CM | POA: Diagnosis not present

## 2018-01-13 DIAGNOSIS — Z8673 Personal history of transient ischemic attack (TIA), and cerebral infarction without residual deficits: Secondary | ICD-10-CM | POA: Diagnosis not present

## 2018-01-18 DIAGNOSIS — I341 Nonrheumatic mitral (valve) prolapse: Secondary | ICD-10-CM | POA: Diagnosis not present

## 2018-01-18 DIAGNOSIS — Z8673 Personal history of transient ischemic attack (TIA), and cerebral infarction without residual deficits: Secondary | ICD-10-CM | POA: Diagnosis not present

## 2018-01-18 DIAGNOSIS — R296 Repeated falls: Secondary | ICD-10-CM | POA: Diagnosis not present

## 2018-01-18 DIAGNOSIS — M150 Primary generalized (osteo)arthritis: Secondary | ICD-10-CM | POA: Diagnosis not present

## 2018-01-18 DIAGNOSIS — R69 Illness, unspecified: Secondary | ICD-10-CM | POA: Diagnosis not present

## 2018-01-18 DIAGNOSIS — R531 Weakness: Secondary | ICD-10-CM | POA: Diagnosis not present

## 2018-01-18 DIAGNOSIS — E039 Hypothyroidism, unspecified: Secondary | ICD-10-CM | POA: Diagnosis not present

## 2018-01-18 DIAGNOSIS — M549 Dorsalgia, unspecified: Secondary | ICD-10-CM | POA: Diagnosis not present

## 2018-01-18 DIAGNOSIS — Z86711 Personal history of pulmonary embolism: Secondary | ICD-10-CM | POA: Diagnosis not present

## 2018-01-18 DIAGNOSIS — J449 Chronic obstructive pulmonary disease, unspecified: Secondary | ICD-10-CM | POA: Diagnosis not present

## 2018-01-20 DIAGNOSIS — J4520 Mild intermittent asthma, uncomplicated: Secondary | ICD-10-CM | POA: Diagnosis not present

## 2018-01-25 DIAGNOSIS — R69 Illness, unspecified: Secondary | ICD-10-CM | POA: Diagnosis not present

## 2018-01-25 DIAGNOSIS — Z86711 Personal history of pulmonary embolism: Secondary | ICD-10-CM | POA: Diagnosis not present

## 2018-01-25 DIAGNOSIS — R531 Weakness: Secondary | ICD-10-CM | POA: Diagnosis not present

## 2018-01-25 DIAGNOSIS — Z8673 Personal history of transient ischemic attack (TIA), and cerebral infarction without residual deficits: Secondary | ICD-10-CM | POA: Diagnosis not present

## 2018-01-25 DIAGNOSIS — R296 Repeated falls: Secondary | ICD-10-CM | POA: Diagnosis not present

## 2018-01-25 DIAGNOSIS — M549 Dorsalgia, unspecified: Secondary | ICD-10-CM | POA: Diagnosis not present

## 2018-01-25 DIAGNOSIS — M150 Primary generalized (osteo)arthritis: Secondary | ICD-10-CM | POA: Diagnosis not present

## 2018-01-25 DIAGNOSIS — J449 Chronic obstructive pulmonary disease, unspecified: Secondary | ICD-10-CM | POA: Diagnosis not present

## 2018-01-25 DIAGNOSIS — I341 Nonrheumatic mitral (valve) prolapse: Secondary | ICD-10-CM | POA: Diagnosis not present

## 2018-01-25 DIAGNOSIS — E039 Hypothyroidism, unspecified: Secondary | ICD-10-CM | POA: Diagnosis not present

## 2018-02-04 DIAGNOSIS — E039 Hypothyroidism, unspecified: Secondary | ICD-10-CM | POA: Diagnosis not present

## 2018-02-04 DIAGNOSIS — R69 Illness, unspecified: Secondary | ICD-10-CM | POA: Diagnosis not present

## 2018-02-04 DIAGNOSIS — E441 Mild protein-calorie malnutrition: Secondary | ICD-10-CM | POA: Diagnosis not present

## 2018-02-04 DIAGNOSIS — M549 Dorsalgia, unspecified: Secondary | ICD-10-CM | POA: Diagnosis not present

## 2018-02-04 DIAGNOSIS — Z7901 Long term (current) use of anticoagulants: Secondary | ICD-10-CM | POA: Diagnosis not present

## 2018-02-04 DIAGNOSIS — M797 Fibromyalgia: Secondary | ICD-10-CM | POA: Diagnosis not present

## 2018-02-22 DIAGNOSIS — Z7901 Long term (current) use of anticoagulants: Secondary | ICD-10-CM | POA: Diagnosis not present

## 2018-03-04 DIAGNOSIS — Z7901 Long term (current) use of anticoagulants: Secondary | ICD-10-CM | POA: Diagnosis not present

## 2018-03-30 DIAGNOSIS — Z7901 Long term (current) use of anticoagulants: Secondary | ICD-10-CM | POA: Diagnosis not present

## 2018-04-07 DIAGNOSIS — R918 Other nonspecific abnormal finding of lung field: Secondary | ICD-10-CM | POA: Diagnosis not present

## 2018-04-07 DIAGNOSIS — E860 Dehydration: Secondary | ICD-10-CM | POA: Diagnosis not present

## 2018-04-07 DIAGNOSIS — N200 Calculus of kidney: Secondary | ICD-10-CM | POA: Diagnosis not present

## 2018-04-07 DIAGNOSIS — E43 Unspecified severe protein-calorie malnutrition: Secondary | ICD-10-CM | POA: Diagnosis not present

## 2018-04-07 DIAGNOSIS — D649 Anemia, unspecified: Secondary | ICD-10-CM | POA: Diagnosis not present

## 2018-04-07 DIAGNOSIS — F0390 Unspecified dementia without behavioral disturbance: Secondary | ICD-10-CM

## 2018-04-07 DIAGNOSIS — Z8673 Personal history of transient ischemic attack (TIA), and cerebral infarction without residual deficits: Secondary | ICD-10-CM | POA: Diagnosis not present

## 2018-04-07 DIAGNOSIS — K219 Gastro-esophageal reflux disease without esophagitis: Secondary | ICD-10-CM | POA: Diagnosis not present

## 2018-04-07 DIAGNOSIS — R69 Illness, unspecified: Secondary | ICD-10-CM | POA: Diagnosis not present

## 2018-04-07 DIAGNOSIS — Z7901 Long term (current) use of anticoagulants: Secondary | ICD-10-CM | POA: Diagnosis not present

## 2018-04-07 DIAGNOSIS — M1990 Unspecified osteoarthritis, unspecified site: Secondary | ICD-10-CM | POA: Diagnosis not present

## 2018-04-07 DIAGNOSIS — R509 Fever, unspecified: Secondary | ICD-10-CM | POA: Diagnosis not present

## 2018-04-07 DIAGNOSIS — Z681 Body mass index (BMI) 19 or less, adult: Secondary | ICD-10-CM | POA: Diagnosis not present

## 2018-04-07 DIAGNOSIS — R410 Disorientation, unspecified: Secondary | ICD-10-CM | POA: Diagnosis not present

## 2018-04-07 DIAGNOSIS — I480 Paroxysmal atrial fibrillation: Secondary | ICD-10-CM | POA: Diagnosis not present

## 2018-04-07 DIAGNOSIS — G9341 Metabolic encephalopathy: Secondary | ICD-10-CM | POA: Diagnosis not present

## 2018-04-08 DIAGNOSIS — R69 Illness, unspecified: Secondary | ICD-10-CM | POA: Diagnosis not present

## 2018-04-08 DIAGNOSIS — D649 Anemia, unspecified: Secondary | ICD-10-CM | POA: Diagnosis not present

## 2018-04-08 DIAGNOSIS — I480 Paroxysmal atrial fibrillation: Secondary | ICD-10-CM | POA: Diagnosis not present

## 2018-04-08 DIAGNOSIS — R410 Disorientation, unspecified: Secondary | ICD-10-CM | POA: Diagnosis not present

## 2018-04-08 DIAGNOSIS — E860 Dehydration: Secondary | ICD-10-CM | POA: Diagnosis not present

## 2018-04-09 DIAGNOSIS — D649 Anemia, unspecified: Secondary | ICD-10-CM | POA: Diagnosis not present

## 2018-04-09 DIAGNOSIS — I480 Paroxysmal atrial fibrillation: Secondary | ICD-10-CM | POA: Diagnosis not present

## 2018-04-09 DIAGNOSIS — E860 Dehydration: Secondary | ICD-10-CM | POA: Diagnosis not present

## 2018-04-09 DIAGNOSIS — R69 Illness, unspecified: Secondary | ICD-10-CM | POA: Diagnosis not present

## 2018-04-09 DIAGNOSIS — R410 Disorientation, unspecified: Secondary | ICD-10-CM | POA: Diagnosis not present

## 2018-04-11 DIAGNOSIS — M810 Age-related osteoporosis without current pathological fracture: Secondary | ICD-10-CM | POA: Diagnosis not present

## 2018-04-11 DIAGNOSIS — Z7901 Long term (current) use of anticoagulants: Secondary | ICD-10-CM | POA: Diagnosis not present

## 2018-04-11 DIAGNOSIS — R69 Illness, unspecified: Secondary | ICD-10-CM | POA: Diagnosis not present

## 2018-04-11 DIAGNOSIS — M1991 Primary osteoarthritis, unspecified site: Secondary | ICD-10-CM | POA: Diagnosis not present

## 2018-04-11 DIAGNOSIS — I480 Paroxysmal atrial fibrillation: Secondary | ICD-10-CM | POA: Diagnosis not present

## 2018-04-11 DIAGNOSIS — J439 Emphysema, unspecified: Secondary | ICD-10-CM | POA: Diagnosis not present

## 2018-04-11 DIAGNOSIS — E860 Dehydration: Secondary | ICD-10-CM | POA: Diagnosis not present

## 2018-04-11 DIAGNOSIS — M797 Fibromyalgia: Secondary | ICD-10-CM | POA: Diagnosis not present

## 2018-04-11 DIAGNOSIS — I051 Rheumatic mitral insufficiency: Secondary | ICD-10-CM | POA: Diagnosis not present

## 2018-04-13 DIAGNOSIS — M150 Primary generalized (osteo)arthritis: Secondary | ICD-10-CM | POA: Diagnosis not present

## 2018-04-13 DIAGNOSIS — Z7901 Long term (current) use of anticoagulants: Secondary | ICD-10-CM | POA: Diagnosis not present

## 2018-04-13 DIAGNOSIS — E441 Mild protein-calorie malnutrition: Secondary | ICD-10-CM | POA: Diagnosis not present

## 2018-04-13 DIAGNOSIS — E876 Hypokalemia: Secondary | ICD-10-CM | POA: Diagnosis not present

## 2018-04-13 DIAGNOSIS — R69 Illness, unspecified: Secondary | ICD-10-CM | POA: Diagnosis not present

## 2018-04-14 DIAGNOSIS — J439 Emphysema, unspecified: Secondary | ICD-10-CM | POA: Diagnosis not present

## 2018-04-14 DIAGNOSIS — R69 Illness, unspecified: Secondary | ICD-10-CM | POA: Diagnosis not present

## 2018-04-14 DIAGNOSIS — E860 Dehydration: Secondary | ICD-10-CM | POA: Diagnosis not present

## 2018-04-14 DIAGNOSIS — M810 Age-related osteoporosis without current pathological fracture: Secondary | ICD-10-CM | POA: Diagnosis not present

## 2018-04-14 DIAGNOSIS — I051 Rheumatic mitral insufficiency: Secondary | ICD-10-CM | POA: Diagnosis not present

## 2018-04-14 DIAGNOSIS — I480 Paroxysmal atrial fibrillation: Secondary | ICD-10-CM | POA: Diagnosis not present

## 2018-04-14 DIAGNOSIS — M797 Fibromyalgia: Secondary | ICD-10-CM | POA: Diagnosis not present

## 2018-04-14 DIAGNOSIS — M1991 Primary osteoarthritis, unspecified site: Secondary | ICD-10-CM | POA: Diagnosis not present

## 2018-04-16 DIAGNOSIS — M810 Age-related osteoporosis without current pathological fracture: Secondary | ICD-10-CM | POA: Diagnosis not present

## 2018-04-16 DIAGNOSIS — R69 Illness, unspecified: Secondary | ICD-10-CM | POA: Diagnosis not present

## 2018-04-16 DIAGNOSIS — J439 Emphysema, unspecified: Secondary | ICD-10-CM | POA: Diagnosis not present

## 2018-04-16 DIAGNOSIS — I051 Rheumatic mitral insufficiency: Secondary | ICD-10-CM | POA: Diagnosis not present

## 2018-04-16 DIAGNOSIS — E860 Dehydration: Secondary | ICD-10-CM | POA: Diagnosis not present

## 2018-04-16 DIAGNOSIS — I480 Paroxysmal atrial fibrillation: Secondary | ICD-10-CM | POA: Diagnosis not present

## 2018-04-16 DIAGNOSIS — M797 Fibromyalgia: Secondary | ICD-10-CM | POA: Diagnosis not present

## 2018-04-16 DIAGNOSIS — M1991 Primary osteoarthritis, unspecified site: Secondary | ICD-10-CM | POA: Diagnosis not present

## 2018-04-20 DIAGNOSIS — I712 Thoracic aortic aneurysm, without rupture: Secondary | ICD-10-CM | POA: Diagnosis not present

## 2018-04-20 DIAGNOSIS — E43 Unspecified severe protein-calorie malnutrition: Secondary | ICD-10-CM | POA: Diagnosis not present

## 2018-04-20 DIAGNOSIS — M1991 Primary osteoarthritis, unspecified site: Secondary | ICD-10-CM | POA: Diagnosis not present

## 2018-04-20 DIAGNOSIS — R69 Illness, unspecified: Secondary | ICD-10-CM | POA: Diagnosis not present

## 2018-04-20 DIAGNOSIS — E876 Hypokalemia: Secondary | ICD-10-CM | POA: Diagnosis not present

## 2018-04-20 DIAGNOSIS — M810 Age-related osteoporosis without current pathological fracture: Secondary | ICD-10-CM | POA: Diagnosis not present

## 2018-04-20 DIAGNOSIS — J439 Emphysema, unspecified: Secondary | ICD-10-CM | POA: Diagnosis not present

## 2018-04-20 DIAGNOSIS — M797 Fibromyalgia: Secondary | ICD-10-CM | POA: Diagnosis not present

## 2018-04-20 DIAGNOSIS — E860 Dehydration: Secondary | ICD-10-CM | POA: Diagnosis not present

## 2018-04-20 DIAGNOSIS — I69398 Other sequelae of cerebral infarction: Secondary | ICD-10-CM | POA: Diagnosis not present

## 2018-04-20 DIAGNOSIS — I051 Rheumatic mitral insufficiency: Secondary | ICD-10-CM | POA: Diagnosis not present

## 2018-04-20 DIAGNOSIS — I480 Paroxysmal atrial fibrillation: Secondary | ICD-10-CM | POA: Diagnosis not present

## 2018-04-20 DIAGNOSIS — E46 Unspecified protein-calorie malnutrition: Secondary | ICD-10-CM | POA: Diagnosis not present

## 2018-04-20 DIAGNOSIS — I4891 Unspecified atrial fibrillation: Secondary | ICD-10-CM | POA: Diagnosis not present

## 2018-04-20 DIAGNOSIS — K219 Gastro-esophageal reflux disease without esophagitis: Secondary | ICD-10-CM | POA: Diagnosis not present

## 2018-04-22 DIAGNOSIS — I712 Thoracic aortic aneurysm, without rupture: Secondary | ICD-10-CM | POA: Diagnosis not present

## 2018-04-22 DIAGNOSIS — R69 Illness, unspecified: Secondary | ICD-10-CM | POA: Diagnosis not present

## 2018-04-22 DIAGNOSIS — I69398 Other sequelae of cerebral infarction: Secondary | ICD-10-CM | POA: Diagnosis not present

## 2018-04-22 DIAGNOSIS — E43 Unspecified severe protein-calorie malnutrition: Secondary | ICD-10-CM | POA: Diagnosis not present

## 2018-04-22 DIAGNOSIS — I4891 Unspecified atrial fibrillation: Secondary | ICD-10-CM | POA: Diagnosis not present

## 2018-04-22 DIAGNOSIS — K219 Gastro-esophageal reflux disease without esophagitis: Secondary | ICD-10-CM | POA: Diagnosis not present

## 2018-05-26 DEATH — deceased

## 2019-01-20 IMAGING — CR DG CHEST 2V
2 series · 2 of 2 positions shown · non-contrast
Comparison: Chest radiograph performed 05/13/2016

CLINICAL DATA: Subacute onset of confusion and disorientation.
Cough. Initial encounter.

EXAM:
CHEST  2 VIEW

[chest lat]
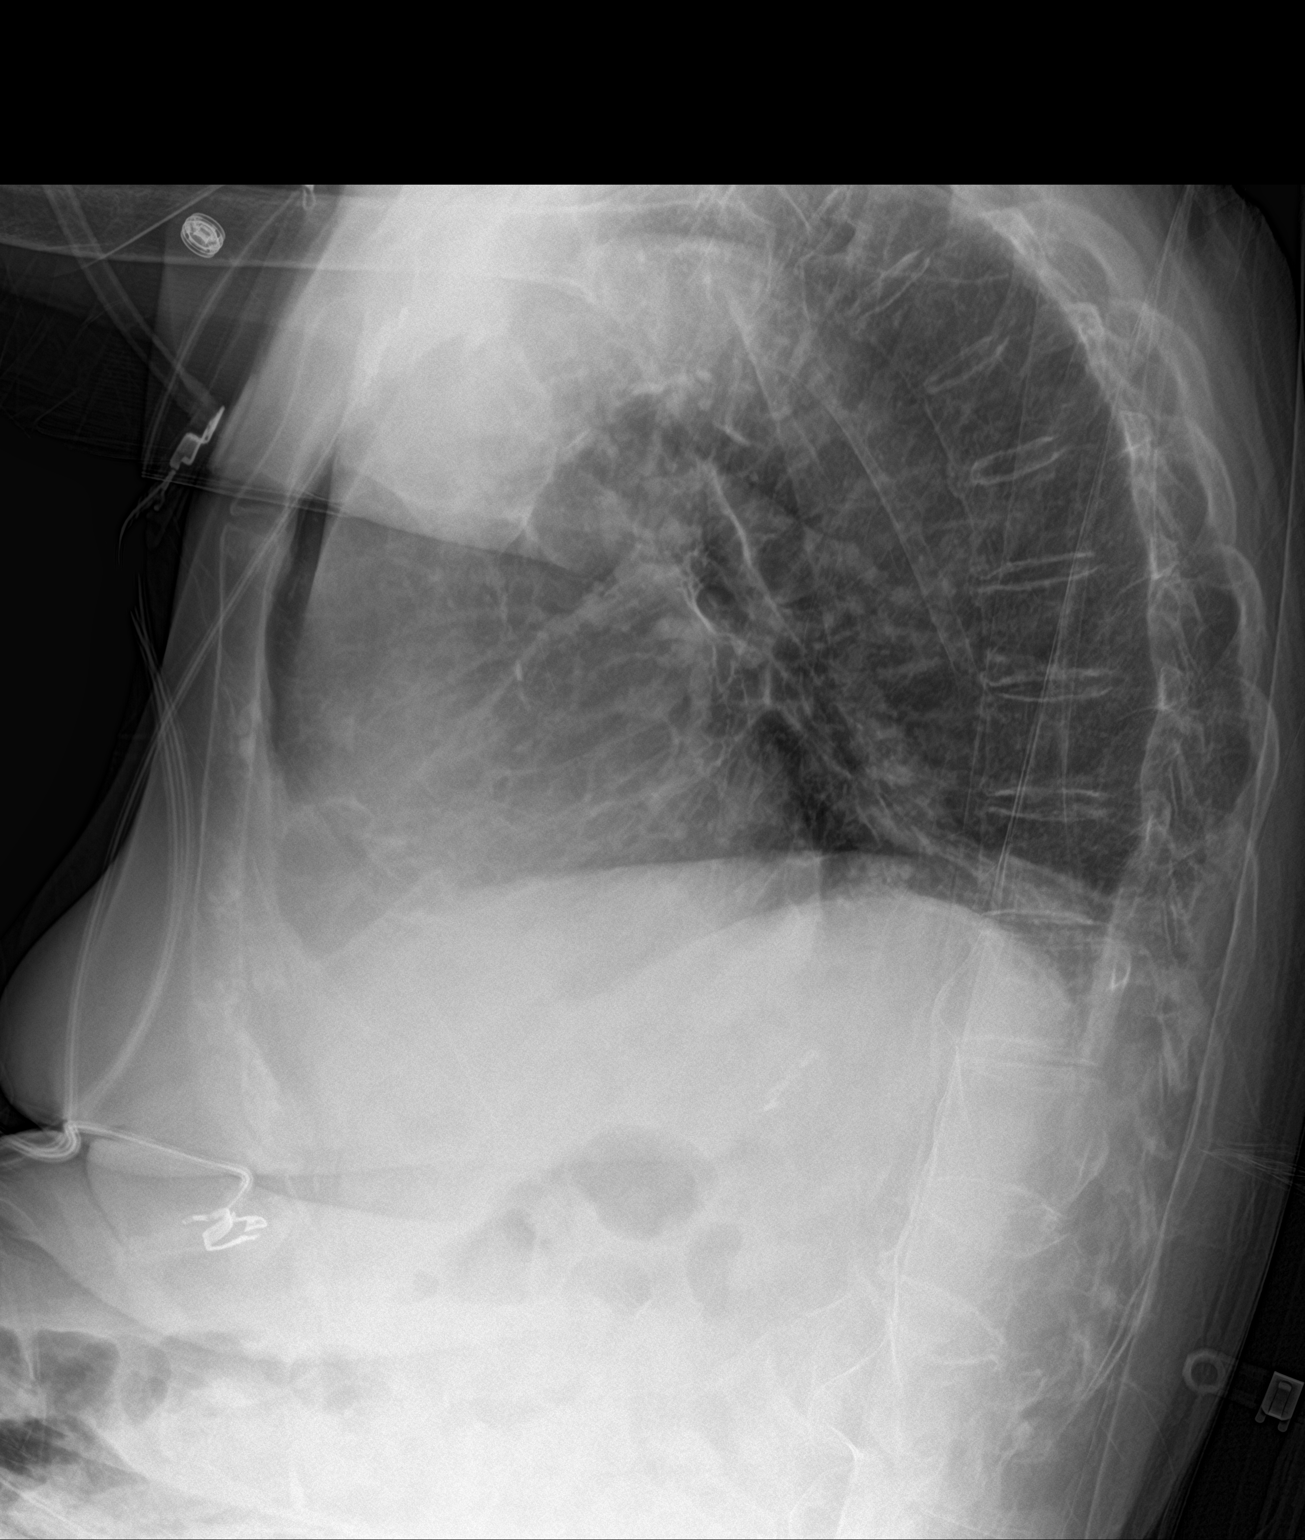

[chest ap]
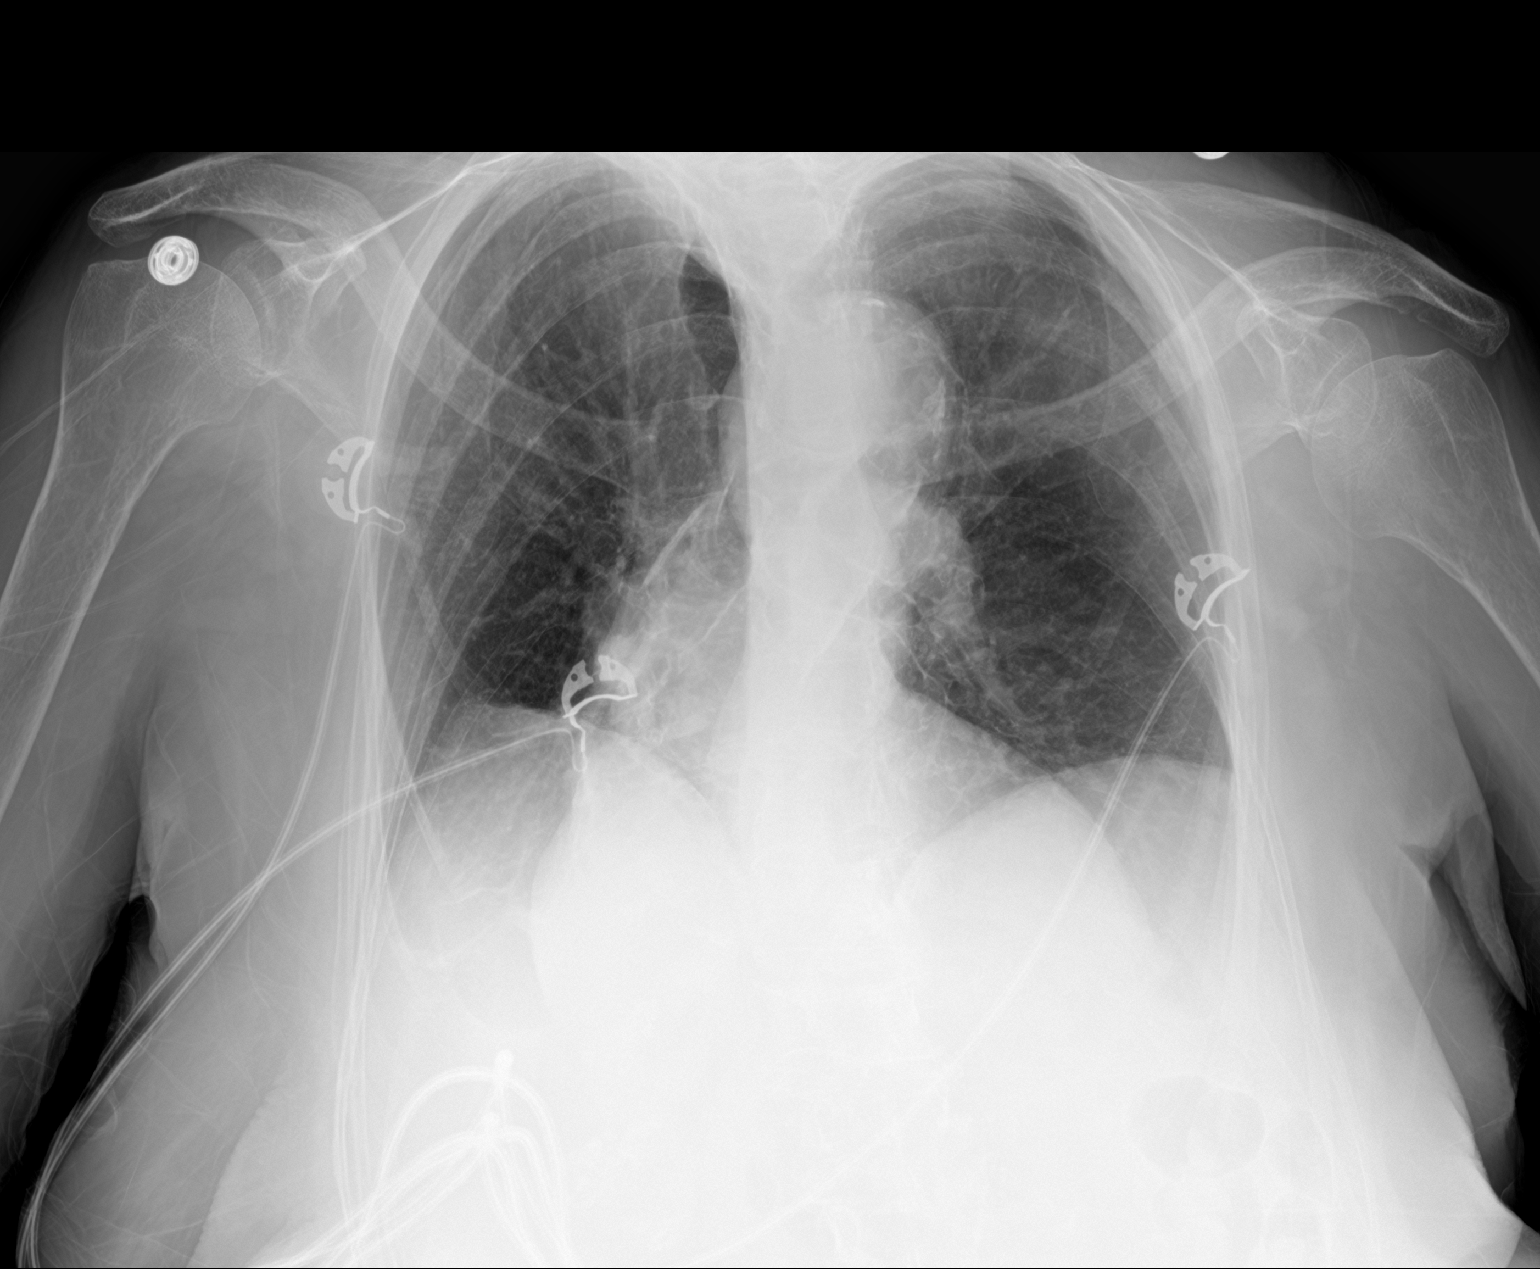

[2 of 2 positions shown; findings below may reference images not displayed]

FINDINGS: The lungs are well-aerated and clear. There is no evidence of focal
opacification, pleural effusion or pneumothorax.

The heart is mildly enlarged. No acute osseous abnormalities are
seen. The liver is unremarkable in appearance. The patient is status
post cholecystectomy, with clips noted at the gallbladder fossa. The
common bile duct remains normal in caliber.
IMPRESSION: Mild cardiomegaly.  Lungs remain grossly clear.
